# Patient Record
Sex: Female | Born: 1995 | Race: Black or African American | Hispanic: No | Marital: Single | Smoking: Former smoker
Health system: Southern US, Community
[De-identification: ages and names within clinical notes are randomized; demographics above are authoritative.]

## PROBLEM LIST (undated history)

## (undated) ENCOUNTER — Inpatient Hospital Stay (HOSPITAL_COMMUNITY): Payer: Self-pay

## (undated) DIAGNOSIS — D649 Anemia, unspecified: Secondary | ICD-10-CM

## (undated) DIAGNOSIS — G009 Bacterial meningitis, unspecified: Secondary | ICD-10-CM

## (undated) DIAGNOSIS — Z789 Other specified health status: Secondary | ICD-10-CM

## (undated) HISTORY — PX: NO PAST SURGERIES: SHX2092

---
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
## 2017-01-28 ENCOUNTER — Emergency Department (HOSPITAL_COMMUNITY)
Admission: EM | Admit: 2017-01-28 | Discharge: 2017-01-28 | Disposition: A | Discharge status: Home / Self Care | Payer: Self-pay | Attending: Emergency Medicine | Admitting: Emergency Medicine

## 2017-01-28 ENCOUNTER — Encounter (HOSPITAL_COMMUNITY): Payer: Self-pay | Admitting: Emergency Medicine

## 2017-01-28 ENCOUNTER — Other Ambulatory Visit: Payer: Self-pay

## 2017-01-28 DIAGNOSIS — N390 Urinary tract infection, site not specified: Secondary | ICD-10-CM | POA: Insufficient documentation

## 2017-01-28 DIAGNOSIS — A5901 Trichomonal vulvovaginitis: Secondary | ICD-10-CM

## 2017-01-28 DIAGNOSIS — N760 Acute vaginitis: Secondary | ICD-10-CM | POA: Insufficient documentation

## 2017-01-28 DIAGNOSIS — B9689 Other specified bacterial agents as the cause of diseases classified elsewhere: Secondary | ICD-10-CM

## 2017-01-28 DIAGNOSIS — A5900 Urogenital trichomoniasis, unspecified: Secondary | ICD-10-CM | POA: Insufficient documentation

## 2017-01-28 LAB — WET PREP, GENITAL
Sperm: NONE SEEN
YEAST WET PREP: NONE SEEN

## 2017-01-28 LAB — URINALYSIS, ROUTINE W REFLEX MICROSCOPIC
Bilirubin Urine: NEGATIVE
GLUCOSE, UA: NEGATIVE mg/dL
HGB URINE DIPSTICK: NEGATIVE
KETONES UR: NEGATIVE mg/dL
NITRITE: POSITIVE — AB
PROTEIN: NEGATIVE mg/dL
Specific Gravity, Urine: 1.025 (ref 1.005–1.030)
pH: 6 (ref 5.0–8.0)

## 2017-01-28 LAB — POC URINE PREG, ED: PREG TEST UR: NEGATIVE

## 2017-01-28 MED ORDER — AZITHROMYCIN 250 MG PO TABS
1000.0000 mg | ORAL_TABLET | Freq: Once | ORAL | Status: AC
  Administered 2017-01-28: 1000 mg via ORAL
  Filled 2017-01-28: qty 4

## 2017-01-28 MED ORDER — METRONIDAZOLE 500 MG PO TABS: 500.0000 mg | ORAL_TABLET | Freq: Two times a day (BID) | ORAL | 0 refills | Status: DC

## 2017-01-28 MED ORDER — LIDOCAINE HCL (PF) 1 % IJ SOLN: 2.0000 mL | Freq: Once | INTRAMUSCULAR | Status: DC

## 2017-01-28 MED ORDER — METRONIDAZOLE 500 MG PO TABS: 2000.0000 mg | ORAL_TABLET | Freq: Once | ORAL | Status: DC

## 2017-01-28 MED ORDER — CEFTRIAXONE SODIUM 250 MG IJ SOLR
250.0000 mg | Freq: Once | INTRAMUSCULAR | Status: AC
  Administered 2017-01-28: 250 mg via INTRAMUSCULAR
  Filled 2017-01-28: qty 250

## 2017-01-28 MED ORDER — CEPHALEXIN 500 MG PO CAPS: 500.0000 mg | ORAL_CAPSULE | Freq: Two times a day (BID) | ORAL | 0 refills | Status: AC

## 2017-01-28 NOTE — Discharge Instructions (Addendum)
Please read the attached information regarding your condition. Take Flagyl and Keflex for one week as directed. We will contact you regarding results of your remaining lab work when available. Return to ED for worsening symptoms, increase pain with urination, abdominal pain, fevers.

## 2017-01-28 NOTE — ED Triage Notes (Signed)
Pt reports vaginal itching x1 month, endorses vaginal discharge but states no odor. Denies any bleeding.

## 2017-01-28 NOTE — ED Provider Notes (Signed)
[facility] EMERGENCY DEPARTMENT Provider Note   CSN: [id number] Arrival date & time: 01/28/17  1135     History   Chief Complaint Chief Complaint  Patient presents with  . Vaginal Itching    HPI Infant Allison Robles is a 21 y.o. female to ED for evaluation of 2-week history of vaginal discharge and vaginal irritation.  She also reports dysuria.  Describes the discharge as white and associated with no foul odor.  States that the last time she had unprotected sexual intercourse was several weeks ago.  Reports history of similar symptoms in the past.  Also reports that her menstrual cycle has been irregular.  She denies any abnormal bleeding, abdominal pain, nausea, vomiting, bowel changes, fever.  HPI  Past Medical History:  Diagnosis Date  . Medical history non-contributory     There are no active problems to display for this patient.   Past Surgical History:  Procedure Laterality Date  . NO PAST SURGERIES      OB History    Gravida Para Term Preterm AB Living   0 0 0 0 0 0   SAB TAB Ectopic Multiple Live Births   0 0 0 0 0       Home Medications    Prior to Admission medications   Medication Sig Start Date End Date Taking? Authorizing Provider  cephALEXin (KEFLEX) 500 MG capsule Take 1 capsule (500 mg total) by mouth 2 (two) times daily for 7 days. 01/28/17 02/04/17  Vianny Schraeder, PA-C  metroNIDAZOLE (FLAGYL) 500 MG tablet Take 1 tablet (500 mg total) by mouth 2 (two) times daily. 01/28/17   Dietrich Pates, PA-C    Family History No family history on file.  Social History Social History   Tobacco Use  . Smoking status: Never Smoker  . Smokeless tobacco: Never Used  Substance Use Topics  . Alcohol use: No  . Drug use: No     Allergies   Patient has no known allergies.   Review of Systems Review of Systems  Constitutional: Negative for appetite change, chills and fever.  HENT: Negative for ear pain, rhinorrhea, sneezing and sore  throat.   Eyes: Negative for photophobia and visual disturbance.  Respiratory: Negative for cough, chest tightness, shortness of breath and wheezing.   Cardiovascular: Negative for chest pain and palpitations.  Gastrointestinal: Negative for abdominal pain, blood in stool, constipation, diarrhea, nausea and vomiting.  Genitourinary: Positive for dysuria, menstrual problem and vaginal discharge. Negative for hematuria and urgency.  Musculoskeletal: Negative for myalgias.  Skin: Negative for rash.  Neurological: Negative for dizziness, weakness and light-headedness.     Physical Exam Updated Vital Signs BP 125/67   Pulse 65   Temp 98.1 F (36.7 C) (Oral)   Resp 12   LMP 01/02/2017 (Approximate)   SpO2 100%   Physical Exam  Constitutional: She appears well-developed and well-nourished. No distress.  HENT:  Head: Normocephalic and atraumatic.  Nose: Nose normal.  Eyes: Conjunctivae and EOM are normal. Left eye exhibits no discharge. No scleral icterus.  Neck: Normal range of motion. Neck supple.  Cardiovascular: Normal rate, regular rhythm, normal heart sounds and intact distal pulses. Exam reveals no gallop and no friction rub.  No murmur heard. Pulmonary/Chest: Effort normal and breath sounds normal. No respiratory distress.  Abdominal: Soft. Bowel sounds are normal. She exhibits no distension. There is no tenderness. There is no guarding.  Genitourinary: There is no tenderness on the right labia. There is no tenderness on the  left labia. Cervix exhibits no motion tenderness and no discharge. Right adnexum displays no tenderness. Left adnexum displays no tenderness. Vaginal discharge found.  Genitourinary Comments: Pelvic exam: normal external genitalia without evidence of trauma. VULVA: normal appearing vulva with no masses, tenderness or lesion. VAGINA: normal appearing vagina with normal color and discharge, no lesions. CERVIX: normal appearing cervix without lesions, cervical  motion tenderness absent, cervical os closed with out purulent discharge; white vaginal discharge noted. Wet prep and DNA probe for chlamydia and GC obtained.   ADNEXA: normal adnexa in size, nontender and no masses UTERUS: uterus is normal size, shape, consistency and nontender.   NT served as chaperone during exam.  Musculoskeletal: Normal range of motion. She exhibits no edema.  Neurological: She is alert. She exhibits normal muscle tone. Coordination normal.  Skin: Skin is warm and dry. No rash noted.  Psychiatric: She has a normal mood and affect.  Nursing note and vitals reviewed.    ED Treatments / Results  Labs (all labs ordered are listed, but only abnormal results are displayed) Labs Reviewed  WET PREP, GENITAL - Abnormal; Notable for the following components:      Result Value   Trich, Wet Prep PRESENT (*)    Clue Cells Wet Prep HPF POC PRESENT (*)    WBC, Wet Prep HPF POC FEW (*)    All other components within normal limits  URINALYSIS, ROUTINE W REFLEX MICROSCOPIC - Abnormal; Notable for the following components:   APPearance HAZY (*)    Nitrite POSITIVE (*)    Leukocytes, UA SMALL (*)    Bacteria, UA MANY (*)    Squamous Epithelial / LPF 0-5 (*)    All other components within normal limits  HIV ANTIBODY (ROUTINE TESTING)  RPR  POC URINE PREG, ED  GC/CHLAMYDIA PROBE AMP ([place]    EKG  EKG Interpretation None       Radiology No results found.  Procedures Procedures (including critical care time)  Medications Ordered in ED Medications  lidocaine (PF) (XYLOCAINE) 1 % injection 2 mL (2 mLs Other Not Given 01/28/17 1621)  cefTRIAXone (ROCEPHIN) injection 250 mg (250 mg Intramuscular Given 01/28/17 1620)  azithromycin (ZITHROMAX) tablet 1,000 mg (1,000 mg Oral Given 01/28/17 1620)     Initial Impression / Assessment and Plan / ED Course  I have reviewed the triage vital signs and the nursing notes.  Pertinent labs & imaging results  that were available during my care of the patient were reviewed by me and considered in my medical decision making (see chart for details).     Patient presents to ED for evaluation of back 2 weeks.  States she is sexually active with out protection.  There is white vaginal discharge noted in vaginal vault.  Patient is concerned for STDs and would like to be empirically treated.  No signs of PID noted on physical exam.  No abdominal tenderness to palpation she is afebrile with no history of fever.  Urinalysis with positive nitrites, leukocytes and bacteria.  Wet prep positive for Trichomonas and clue cells.  GC, chlamydia, HIV and RPR pending at this time.  Urine pregnancy is negative.  Will treat with Flagyl for trichomonas and BV and advised her to follow-up with results of remaining lab work when available. Patient appears stable for discharge at this time.  Strict return precautions given.  Final Clinical Impressions(s) / ED Diagnoses   Final diagnoses:  BV (bacterial vaginosis)  Trichomoniasis of vagina  Lower urinary tract infectious disease    ED Discharge Orders        Ordered    cephALEXin (KEFLEX) 500 MG capsule  2 times daily     01/28/17 1624    metroNIDAZOLE (FLAGYL) 500 MG tablet  2 times daily     01/28/17 1624       Dietrich PatesKhatri, Lizann Edelman, PA-C 01/28/17 1630    Melene PlanFloyd, Dan, DO 01/29/17 1603

## 2017-01-29 LAB — GC/CHLAMYDIA PROBE AMP (~~LOC~~) NOT AT ARMC
Chlamydia: NEGATIVE
Neisseria Gonorrhea: POSITIVE — AB

## 2017-01-29 LAB — RPR: RPR: NONREACTIVE

## 2017-01-29 LAB — HIV ANTIBODY (ROUTINE TESTING W REFLEX): HIV Screen 4th Generation wRfx: NONREACTIVE

## 2018-01-10 ENCOUNTER — Emergency Department (HOSPITAL_COMMUNITY)
Admission: EM | Admit: 2018-01-10 | Discharge: 2018-01-10 | Disposition: A | Discharge status: Home / Self Care | Payer: Self-pay | Attending: Emergency Medicine | Admitting: Emergency Medicine

## 2018-01-10 ENCOUNTER — Encounter (HOSPITAL_COMMUNITY): Payer: Self-pay | Admitting: Obstetrics and Gynecology

## 2018-01-10 ENCOUNTER — Emergency Department (HOSPITAL_COMMUNITY): Payer: Self-pay

## 2018-01-10 ENCOUNTER — Other Ambulatory Visit: Payer: Self-pay

## 2018-01-10 DIAGNOSIS — Z3A00 Weeks of gestation of pregnancy not specified: Secondary | ICD-10-CM | POA: Insufficient documentation

## 2018-01-10 DIAGNOSIS — O9229 Other disorders of breast associated with pregnancy and the puerperium: Secondary | ICD-10-CM | POA: Insufficient documentation

## 2018-01-10 DIAGNOSIS — N644 Mastodynia: Secondary | ICD-10-CM

## 2018-01-10 DIAGNOSIS — Z3201 Encounter for pregnancy test, result positive: Secondary | ICD-10-CM

## 2018-01-10 DIAGNOSIS — F17290 Nicotine dependence, other tobacco product, uncomplicated: Secondary | ICD-10-CM | POA: Insufficient documentation

## 2018-01-10 DIAGNOSIS — O99334 Smoking (tobacco) complicating childbirth: Secondary | ICD-10-CM | POA: Insufficient documentation

## 2018-01-10 HISTORY — DX: Anemia, unspecified: D64.9

## 2018-01-10 LAB — POC URINE PREG, ED: PREG TEST UR: POSITIVE — AB

## 2018-01-10 NOTE — ED Triage Notes (Signed)
Per Pt: Pt reports concerns about her right nipple because it is cold outside and it got hard and started to hurt. Pt reports it has never hurt before and she is concerned. Pt reports the pain shot to her back. Pt is wearing a tube top at this time.  Pt reports she has cut down on her marijuana usage and is unsure if she is pregnant.

## 2018-01-10 NOTE — Discharge Instructions (Signed)
You were seen in the ER today for pain to the nipple/breast. Your work up here was overall reassuring. Your pregnancy test is positive. We would like you to start taking a prenatal vitamin daily- please discuss this with the pharmacist and they can help you find the correct type of prenatal vitamin. Please do not drink alcohol or do drugs. Please do not smoke cigarettes. Please only take tylenol per over the counter dosing for pain as this is what is safe in pregnancy.   Please follow up with the obgyn clinic in your discharge instruction within 1 week to establish care and have further testing. Return to the ER or go to the MAU (women's ER in your discharge instructions) for any new or worsening symptoms or any other concerns including but not limited to persistent pain, worsened pain, trouble breathing, redness or drainage from the nipple, abdominal pain, or vaginal bleeding.

## 2018-01-10 NOTE — ED Provider Notes (Signed)
[facility]-EMERGENCY DEPT Provider Note   CSN: [id number] Arrival date & time: 01/10/18  1924     History   Chief Complaint Chief Complaint  Patient presents with  . Breast Pain  . Back Pain    HPI Allison Robles is a 22 y.o. female with a hx of anemia and tobacco abuse who presents to the ED with complaint of breast pain. Patient states that yesterday she walked out into the cold and developed pain to the L nipple which radiated to her lateral chest wall and around to the back. Pain lasted less than 30 minutes, was sharp in nature, and was alleviated with being out of the cold. She state since pain improved she did not come to the ER. Today she again went into the cold and had a similar pain develop to bilateral nipples. The sxs quickly resolved on the R, but have persisted on the L prompting ER visit. Only area of pain is the L nipple, currently a 4/10 in severity, feels "sharp and sensitive." No intervention tried PTA. Denies chest pain, dyspnea, nausea, or vomiting. Denies color change, fever, or nipple drainage. Denies leg pain/swelling, cough, hemoptysis, recent surgery/trauma, recent long travel, hormone use, personal hx of cancer, or hx of DVT/PE.   Patient believes her LMP was over 1 month ago and that she is late. Unsure of pregnancy or not. Denies abdominal/pelvic pain or vaginal bleeding.     HPI  Past Medical History:  Diagnosis Date  . Anemia   . Medical history non-contributory     There are no active problems to display for this patient.   Past Surgical History:  Procedure Laterality Date  . NO PAST SURGERIES       OB History    Gravida  0   Para  0   Term  0   Preterm  0   AB  0   Living  0     SAB  0   TAB  0   Ectopic  0   Multiple  0   Live Births  0            Home Medications    Prior to Admission medications   Medication Sig Start Date End Date Taking? Authorizing Provider  metroNIDAZOLE (FLAGYL)  500 MG tablet Take 1 tablet (500 mg total) by mouth 2 (two) times daily. 01/28/17   Dietrich Pates, PA-C    Family History History reviewed. No pertinent family history.  Social History Social History   Tobacco Use  . Smoking status: Current Every Day Smoker    Types: Cigars  . Smokeless tobacco: Never Used  . Tobacco comment: Pt smokes 3 black and milds a day  Substance Use Topics  . Alcohol use: No  . Drug use: Yes    Types: Marijuana     Allergies   Patient has no known allergies.   Review of Systems Review of Systems  Constitutional: Negative for chills and fever.  Respiratory: Negative for cough and shortness of breath.   Cardiovascular: Negative for chest pain and leg swelling.  Gastrointestinal: Negative for abdominal pain, diarrhea, nausea and vomiting.  Genitourinary: Negative for dysuria, vaginal bleeding and vaginal discharge.       Positive for breast pain.   All other systems reviewed and are negative.    Physical Exam Updated Vital Signs BP 121/75 (BP Location: Left Arm)   Pulse 82   Temp 97.7 F (36.5 C) (Oral)   Resp  16   LMP 12/06/2017 (Approximate)   SpO2 99%   Physical Exam  Constitutional: She appears well-developed and well-nourished.  Non-toxic appearance. No distress.  HENT:  Head: Normocephalic and atraumatic.  Eyes: Conjunctivae are normal. Right eye exhibits no discharge. Left eye exhibits no discharge.  Neck: Neck supple.  Cardiovascular: Normal rate and regular rhythm.  Pulmonary/Chest: Effort normal and breath sounds normal. No respiratory distress. She has no wheezes. She has no rhonchi. She has no rales. Right breast exhibits no inverted nipple, no mass, no nipple discharge, no skin change and no tenderness. Left breast exhibits tenderness (to areola, otherwise nontender). Left breast exhibits no inverted nipple, no mass, no nipple discharge and no skin change.  Respiration even and unlabored. Pectus deformity noted.   No overlying  erythema, warmth, or palpable fluctuance to the breast region. No obvious abscess. No signs of infection.  EDT present as chaperone.   Abdominal: Soft. She exhibits no distension. There is no tenderness. There is no rebound and no guarding.  Musculoskeletal: She exhibits no edema or tenderness.  Neurological: She is alert.  Clear speech.   Skin: Skin is warm and dry. No rash noted.  Psychiatric: She has a normal mood and affect. Her behavior is normal.  Nursing note and vitals reviewed.    ED Treatments / Results  Labs (all labs ordered are listed, but only abnormal results are displayed) Labs Reviewed  POC URINE PREG, ED - Abnormal; Notable for the following components:      Result Value   Preg Test, Ur POSITIVE (*)    All other components within normal limits    EKG EKG Interpretation  Date/Time:  Sunday January 10 2018 20:06:53 EST Ventricular Rate:  87 PR Interval:    QRS Duration: 73 QT Interval:  328 QTC Calculation: 395 R Axis:   98 Text Interpretation:  Sinus rhythm Nonspecific T wave abnormality No previous tracing Confirmed by Cathren LaineSteinl, Kevin ([id number]) on 01/10/2018 9:09:12 PM   Radiology Dg Chest 2 View  Result Date: 01/10/2018 CLINICAL DATA:  Concerns about right nipple. Right nipple hurt when it got cold outside. Pain radiated to back. EXAM: CHEST - 2 VIEW COMPARISON:  None. FINDINGS: Haziness adjacent to the right side of the heart is likely due to the patient's pectus deformity best appreciated on the lateral view. The cardiomediastinal silhouette is otherwise normal. No pneumothorax. No other acute abnormalities. IMPRESSION: The haziness adjacent to the right heart border is likely due to the patient's pectus deformity. No other abnormalities noted. Electronically Signed   By: Gerome Samavid  Williams III M.D   On: 01/10/2018 20:47    Procedures Procedures (including critical care time)  Medications Ordered in ED Medications - No data to display   Initial  Impression / Assessment and Plan / ED Course  I have reviewed the triage vital signs and the nursing notes.  Pertinent labs & imaging results that were available during my care of the patient were reviewed by me and considered in my medical decision making (see chart for details).   Patient presents to the ED with complaint of breast pain, more so to the nipple area after being out in the cold. Exam fairly benign, notable for some tenderness to the areola region. She has no overlying erythema/warmth to suggest infectious process such as mastitis, no palpable fluctuance to suggest abscess. No palpable masses. Screening EKG and CXR ordered given pain radiated to diffuse chest wall yesterday, not today, seems focal to breast- no STEMI, no  acute findings on CXR such as PTX or pneumonia. Her pregnancy test today is positive, while patient is in a hypercoagulable state with being pregnant, wells score is low risk,  The pain is reproducible with areolar palpation and her ambulatory SpO2 remains > 95%, doubt PE. Patient without abdominal pain or vaginal bleeding to prompt emergent ultrasound regarding new pregnancy. Suspect breast pain is multifactorial with cold weather and pregnancy. Discussed starting prenatal, cessation of tobacco/alcohol/drugs, and discussing any medications with pharmacy to determine safety in pregnancy. Follow up with women's. I discussed results, treatment plan, need for follow-up, and return precautions with the patient. Provided opportunity for questions, patient confirmed understanding and is in agreement with plan.    Final Clinical Impressions(s) / ED Diagnoses   Final diagnoses:  Nipple pain  Positive pregnancy test  Breast pain    ED Discharge Orders    None       Cherly Anderson, PA-C 01/10/18 2121    Cathren Laine, MD 01/11/18 1146

## 2018-01-17 ENCOUNTER — Emergency Department (HOSPITAL_COMMUNITY)
Admission: EM | Admit: 2018-01-17 | Discharge: 2018-01-17 | Disposition: A | Discharge status: Home / Self Care | Payer: Medicaid Other | Attending: Emergency Medicine | Admitting: Emergency Medicine

## 2018-01-17 ENCOUNTER — Other Ambulatory Visit: Payer: Self-pay

## 2018-01-17 ENCOUNTER — Encounter (HOSPITAL_COMMUNITY): Payer: Self-pay | Admitting: Emergency Medicine

## 2018-01-17 DIAGNOSIS — Z3A08 8 weeks gestation of pregnancy: Secondary | ICD-10-CM | POA: Insufficient documentation

## 2018-01-17 DIAGNOSIS — F17210 Nicotine dependence, cigarettes, uncomplicated: Secondary | ICD-10-CM | POA: Insufficient documentation

## 2018-01-17 DIAGNOSIS — Z0489 Encounter for examination and observation for other specified reasons: Secondary | ICD-10-CM | POA: Insufficient documentation

## 2018-01-17 NOTE — ED Triage Notes (Signed)
Patient reports positive pregnancy test when seen on 11/17. Patient denies follow up with outpatient clinic. Stating "I want to know how far along I am."

## 2018-01-17 NOTE — ED Provider Notes (Signed)
[facility]-EMERGENCY DEPT Provider Note   CSN: [id number] Arrival date & time: 01/17/18  1832     History   Chief Complaint Chief Complaint  Patient presents with  . Routine Prenatal Visit    HPI Allison Robles is a 22 y.o. female who presents to the ED with request to see how far pregnant she is. Patient was evaluated here 11/17 for breast tenderness. Patient denies any other problems. She was given [facility] at her last visit but did not schedule an appointment. Patient not having any problems and is requesting information regarding abortion. Chart states patient's LMP was 10/27 but she reports her LMP was 11/20/17.   HPI  Past Medical History:  Diagnosis Date  . Anemia   . Medical history non-contributory     There are no active problems to display for this patient.   Past Surgical History:  Procedure Laterality Date  . NO PAST SURGERIES       OB History    Gravida  0   Para  0   Term  0   Preterm  0   AB  0   Living  0     SAB  0   TAB  0   Ectopic  0   Multiple  0   Live Births  0            Home Medications    Prior to Admission medications   Medication Sig Start Date End Date Taking? Authorizing Provider  metroNIDAZOLE (FLAGYL) 500 MG tablet Take 1 tablet (500 mg total) by mouth 2 (two) times daily. 01/28/17   Dietrich Pates, PA-C    Family History No family history on file.  Social History Social History   Tobacco Use  . Smoking status: Current Every Day Smoker    Types: Cigars  . Smokeless tobacco: Never Used  . Tobacco comment: Pt smokes 3 black and milds a day  Substance Use Topics  . Alcohol use: No  . Drug use: Yes    Types: Marijuana     Allergies   Patient has no known allergies.   Review of Systems Review of Systems  Genitourinary:       Pregnant  All other systems reviewed and are negative.    Physical Exam Updated Vital Signs BP (!) 142/86 (BP  Location: Left Arm)   Pulse 71   Temp 98.6 F (37 C) (Oral)   Resp 18   LMP 12/20/2017 (Approximate)   SpO2 100%   Physical Exam  Constitutional: She appears well-developed and well-nourished. No distress.  HENT:  Head: Normocephalic.  Eyes: EOM are normal.  Neck: Neck supple.  Cardiovascular: Normal rate.  Pulmonary/Chest: Effort normal.  Neurological: She is alert.  Skin: Skin is warm and dry.  Psychiatric: She has a normal mood and affect.  Nursing note and vitals reviewed.    ED Treatments / Results  Labs (all labs ordered are listed, but only abnormal results are displayed) Labs Reviewed - No data to display  Radiology No results found.  Procedures Procedures (including critical care time)  Medications Ordered in ED Medications - No data to display   Initial Impression / Assessment and Plan / ED Course  I have reviewed the triage vital signs and the nursing notes. 22 y.o. female here for information concerning how far pregnant she is and for information regarding abortion. I discussed with the patient that she can [facility] and discuss options for the pregnancy and the visit is free. I discussed if she is sure she is going for abortion she can look on the Internet for clinics. Patient stable for d/c without any problems today. Final Clinical Impressions(s) / ED Diagnoses   Final diagnoses:  [redacted] weeks gestation of pregnancy    ED Discharge Orders    None       Kerrie Buffaloeese, Welton Bord [place], [place] 01/17/18 2129    Tegeler, Canary Brimhristopher J, MD 01/17/18 [phone]

## 2018-06-15 ENCOUNTER — Telehealth: Payer: Self-pay | Admitting: Obstetrics and Gynecology

## 2018-06-23 NOTE — Telephone Encounter (Signed)
Opened in error

## 2018-10-11 ENCOUNTER — Other Ambulatory Visit: Payer: Self-pay

## 2018-10-11 ENCOUNTER — Ambulatory Visit (INDEPENDENT_AMBULATORY_CARE_PROVIDER_SITE_OTHER): Payer: Medicaid Other | Admitting: General Practice

## 2018-10-11 DIAGNOSIS — Z3201 Encounter for pregnancy test, result positive: Secondary | ICD-10-CM | POA: Diagnosis not present

## 2018-10-11 DIAGNOSIS — Z3491 Encounter for supervision of normal pregnancy, unspecified, first trimester: Secondary | ICD-10-CM

## 2018-10-11 LAB — POCT PREGNANCY, URINE: Preg Test, Ur: POSITIVE — AB

## 2018-10-11 MED ORDER — PRENATAL PLUS 27-1 MG PO TABS: 1.0000 | ORAL_TABLET | Freq: Every day | ORAL | 11 refills | Status: DC

## 2018-10-11 MED ORDER — PRENATAL PLUS 27-1 MG PO TABS: 1.0000 | ORAL_TABLET | Freq: Every day | ORAL | 0 refills | Status: DC

## 2018-10-11 NOTE — Progress Notes (Signed)
Patient presents to office today for UPT. UPT +. Patient reports first positive home test last week. Patient reports recent TAB on 5/18 and bled until early June- has not had a period since then. Patient is not taking any meds/vitamins. Will schedule for ultrasound to establish dating- scheduled 8/25 @ 1pm. PNV Rx sent to pharmacy & patient informed. Patient will return to office next week for ultrasound results.  Koren Bound RN BSN 10/11/18

## 2018-10-11 NOTE — Progress Notes (Signed)
Patient seen and assessed by nursing staff.  Agree with documentation and plan.  

## 2018-10-19 ENCOUNTER — Ambulatory Visit (INDEPENDENT_AMBULATORY_CARE_PROVIDER_SITE_OTHER): Payer: Self-pay

## 2018-10-19 ENCOUNTER — Ambulatory Visit (HOSPITAL_COMMUNITY)
Admission: RE | Admit: 2018-10-19 | Discharge: 2018-10-19 | Disposition: A | Discharge status: Home / Self Care | Payer: Self-pay | Source: Ambulatory Visit | Attending: Family Medicine | Admitting: Family Medicine

## 2018-10-19 ENCOUNTER — Other Ambulatory Visit: Payer: Self-pay

## 2018-10-19 ENCOUNTER — Other Ambulatory Visit: Payer: Self-pay | Admitting: Family Medicine

## 2018-10-19 ENCOUNTER — Encounter: Payer: Self-pay | Admitting: Family Medicine

## 2018-10-19 DIAGNOSIS — Z712 Person consulting for explanation of examination or test findings: Secondary | ICD-10-CM

## 2018-10-19 DIAGNOSIS — Z3491 Encounter for supervision of normal pregnancy, unspecified, first trimester: Secondary | ICD-10-CM

## 2018-10-19 NOTE — Progress Notes (Signed)
Pt here today for OB US results.  Notified Maye Hides, CNM results.  Pt informed that she has a viable pregnancy with EDD 04/29/19, FHR 151 bpm, 12w 4d today.  Medications reconciled.  [place] pics given to pt.  [place] office to provide proof of pregnancy letter to start OB care.  Pt did not have any further questions.   Mel Almond, RN 10/19/18

## 2018-10-24 NOTE — Progress Notes (Signed)
I reviewed the note and agree with the nursing assessment and plan.   Mcarthur Ivins, CNM 05/22/2017 10:32 AM   

## 2018-11-16 ENCOUNTER — Telehealth: Payer: Self-pay | Admitting: Obstetrics & Gynecology

## 2018-11-16 ENCOUNTER — Telehealth (INDEPENDENT_AMBULATORY_CARE_PROVIDER_SITE_OTHER): Payer: Self-pay

## 2018-11-16 DIAGNOSIS — Z3A16 16 weeks gestation of pregnancy: Secondary | ICD-10-CM

## 2018-11-16 NOTE — Telephone Encounter (Signed)
Pt left message on nurse call voicemail requesting additional copy of proof of pregnancy letter.   Called pt to address concerns. Pt states she came to the front office today and was provided with a copy of the requested letter.   Pt has no further questions or concerns at this time.   Apolonio Schneiders, RN 11/16/18

## 2018-11-16 NOTE — Telephone Encounter (Signed)
Spoke with patient about her appointment on 9/23 @ 10:30. Patient instructed that the appointment is a telephone visit. Patient instructed to be available at her appointment time. Patient verbalized understanding.

## 2018-11-17 ENCOUNTER — Ambulatory Visit (INDEPENDENT_AMBULATORY_CARE_PROVIDER_SITE_OTHER): Payer: Self-pay | Admitting: *Deleted

## 2018-11-17 ENCOUNTER — Encounter: Payer: Medicaid Other | Admitting: Obstetrics & Gynecology

## 2018-11-17 ENCOUNTER — Other Ambulatory Visit: Payer: Self-pay

## 2018-11-17 DIAGNOSIS — Z3490 Encounter for supervision of normal pregnancy, unspecified, unspecified trimester: Secondary | ICD-10-CM | POA: Insufficient documentation

## 2018-11-17 NOTE — Progress Notes (Signed)
10:30 I called Marayah and left a message I am calling for your telephone visit and will call again in a few minutes since I did not reach you- please be available by phone.  Linda,RN  10:37 I called Hedi and left a message I am calling for your telephone visit and since I did not reach you - your visit will need to be rescheduled- please call our office- I will try your contact number also. I called her contact number Alden Benjamin) and left a message I am trying to reach [place] and you are listed as a contact- please inform her we are trying to reach her re: her appointment and to call us.  Linda,RN

## 2018-11-19 ENCOUNTER — Other Ambulatory Visit: Payer: Self-pay | Admitting: Family Medicine

## 2018-11-19 DIAGNOSIS — Z3491 Encounter for supervision of normal pregnancy, unspecified, first trimester: Secondary | ICD-10-CM

## 2018-12-06 ENCOUNTER — Ambulatory Visit: Payer: Self-pay | Admitting: *Deleted

## 2018-12-06 ENCOUNTER — Other Ambulatory Visit: Payer: Self-pay

## 2018-12-06 DIAGNOSIS — Z3490 Encounter for supervision of normal pregnancy, unspecified, unspecified trimester: Secondary | ICD-10-CM

## 2018-12-06 NOTE — Progress Notes (Signed)
2:27: I called Suellyn for her new OB Intake phone visit and heard a message " I'm sorry your call cannot be completed at this time- please hang up and try your  call again later". Shatoria Stooksbury,RN 2:37 : I called Larrissa for her new OB Intake phone visit and heard a message " I'm sorry your call cannot be completed at this time- please hang up and try your  call again later". I called her contact number and left a message we are trying to reach [place] and you are listed as a contact number- can you please give her the message we are trying to reach her and to call us? Will leave message for registrar to reschedule her new ob intake. Tawana Pasch,RN

## 2018-12-19 NOTE — Progress Notes (Signed)
History:   Allison Robles is a 23 y.o. G1P0000 at 64w3dby early [place] seen today for her first obstetrical visit.  Her obstetrical history is significant for first pregnancy. Patient does intend to breast feed. Pregnancy history fully reviewed.  Pt reports this is a desired and planned pregnancy. Allergies: NKDA Current Medications: PNVs PMH: none. No HTN, DM, asthma. PSH: none. OB Hx: this is patient's first pregnancy Social Hx: pt does not smoke, drink, or use drugs. Family Hx: none. Pt accepts flu vaccine.  Patient reports headache. Pt rates HA as 7/10 today and has persisted for the past few weeks. BP normal today. Pt reports she has not taken anything for her HA. Pt advised to take Tylenol 10064mafter returning home and if HA does not resolve, pt to present to MAU.     HISTORY: OB History  Gravida Para Term Preterm AB Living  1 0 0 0 0 0  SAB TAB Ectopic Multiple Live Births  0 0 0 0 0    # Outcome Date GA Lbr Len/2nd Weight Sex Delivery Anes PTL Lv  1 Current             Last pap smear was done today.  Past Medical History:  Diagnosis Date  . Anemia   . Medical history non-contributory    Past Surgical History:  Procedure Laterality Date  . NO PAST SURGERIES     History reviewed. No pertinent family history. Social History   Tobacco Use  . Smoking status: Current Every Day Smoker    Types: Cigars  . Smokeless tobacco: Never Used  . Tobacco comment: Pt smokes 3 black and milds a day  Substance Use Topics  . Alcohol use: No  . Drug use: Yes    Types: Marijuana   No Known Allergies Current Outpatient Medications on File Prior to Visit  Medication Sig Dispense Refill  . Prenatal Vit-Fe Fumarate-FA (PREPLUS) 27-1 MG TABS TAKE 1 TABLET BY MOUTH DAILY AT 12 NOON. 30 tablet 0   No current facility-administered medications on file prior to visit.     Review of Systems Pertinent items noted in HPI and remainder of comprehensive ROS otherwise  negative. Physical Exam:   Vitals:   12/20/18 1016  BP: 113/71  Pulse: 80  Weight: 133 lb 12.8 oz (60.7 kg)   Fetal Heart Rate (bpm): 150 Uterus:     Pelvic Exam: Perineum: no hemorrhoids, normal perineum   Vulva: normal external genitalia, no lesions   Vagina:  normal mucosa, normal discharge   Cervix: no lesions and normal, pap smear done.    Adnexa: normal adnexa and no mass, fullness, tenderness   Bony Pelvis: average  System: General: well-developed, well-nourished female in no acute distress   Breasts:  normal appearance, no masses or tenderness bilaterally   Skin: normal coloration and turgor, no rashes   Neurologic: oriented, normal, negative, normal mood   Extremities: normal strength, tone, and muscle mass, ROM of all joints is normal   HEENT PERRLA, extraocular movement intact and sclera clear, anicteric   Mouth/Teeth mucous membranes moist, pharynx normal without lesions and dental hygiene good   Neck supple and no masses   Cardiovascular: regular rate and rhythm   Respiratory:  no respiratory distress, normal breath sounds   Abdomen: soft, non-tender; bowel sounds normal; no masses,  no organomegaly      Assessment:    Pregnancy: G1P0000 Patient Active Problem List   Diagnosis Date Noted  . Supervision  of low-risk pregnancy 11/17/2018     Plan:    1. Encounter for supervision of low-risk pregnancy in second trimester - see above for pt HA today - AFP today - Cytology - PAP( [place]) - Obstetric Panel, Including HIV - Urine Culture - Flu Vaccine QUAD 36+ mos IM - Genetic Screening - US MFM OB COMP + 14 WK; Future - CHL AMB BABYSCRIPTS SCHEDULE OPTIMIZATION - Comp Met (CMET) - Protein / creatinine ratio, urine  Initial labs drawn. Continue prenatal vitamins. Genetic Screening discussed, NIPS: requested. Ultrasound discussed; fetal anatomic survey: requested. Problem list reviewed and updated. The nature of [facility] [place] with multiple MDs and other Advanced Practice Providers was explained to patient; also emphasized that residents, students are part of our team. Routine obstetric precautions reviewed. Return in about 4 weeks (around 01/17/2019) for virtual visit, needs anatomy scan ASAP order entered.     Clarisa Fling, NP  11:28 AM 12/20/2018 [facility] for [employer], [place]

## 2018-12-20 ENCOUNTER — Other Ambulatory Visit: Payer: Self-pay

## 2018-12-20 ENCOUNTER — Ambulatory Visit (INDEPENDENT_AMBULATORY_CARE_PROVIDER_SITE_OTHER): Payer: Self-pay | Admitting: Women's Health

## 2018-12-20 ENCOUNTER — Encounter: Payer: Self-pay | Admitting: Women's Health

## 2018-12-20 ENCOUNTER — Other Ambulatory Visit (HOSPITAL_COMMUNITY)
Admission: RE | Admit: 2018-12-20 | Discharge: 2018-12-20 | Disposition: A | Discharge status: Home / Self Care | Payer: Medicaid Other | Source: Ambulatory Visit | Attending: Women's Health | Admitting: Women's Health

## 2018-12-20 VITALS — BP 113/71 | HR 80 | Wt 133.8 lb

## 2018-12-20 DIAGNOSIS — Z3492 Encounter for supervision of normal pregnancy, unspecified, second trimester: Secondary | ICD-10-CM | POA: Diagnosis present

## 2018-12-20 DIAGNOSIS — Z23 Encounter for immunization: Secondary | ICD-10-CM

## 2018-12-20 DIAGNOSIS — Z3A21 21 weeks gestation of pregnancy: Secondary | ICD-10-CM

## 2018-12-20 MED ORDER — BLOOD PRESSURE MONITORING DEVI: 1.0000 | 0 refills | Status: AC

## 2018-12-20 NOTE — Addendum Note (Signed)
Addended by: Bethanne Ginger on: 12/20/2018 01:21 PM   Modules accepted: Orders

## 2018-12-20 NOTE — Patient Instructions (Signed)
Safe Medications in Pregnancy    Acne: Benzoyl Peroxide Salicylic Acid  Backache/Headache: Tylenol: 2 regular strength every 4 hours OR              2 Extra strength every 6 hours  Colds/Coughs/Allergies: Benadryl (alcohol free) 25 mg every 6 hours as needed Breath right strips Claritin Cepacol throat lozenges Chloraseptic throat spray Cold-Eeze- up to three times per day Cough drops, alcohol free Flonase (by prescription only) Guaifenesin Mucinex Robitussin DM (plain only, alcohol free) Saline nasal spray/drops Sudafed (pseudoephedrine) & Actifed ** use only after [redacted] weeks gestation and if you do not have high blood pressure Tylenol Vicks Vaporub Zinc lozenges Zyrtec   Constipation: Colace Ducolax suppositories Fleet enema Glycerin suppositories Metamucil Milk of magnesia Miralax Senokot Smooth move tea  Diarrhea: Kaopectate Imodium A-D  *NO pepto Bismol  Hemorrhoids: Anusol Anusol HC Preparation H Tucks  Indigestion: Tums Maalox Mylanta Zantac  Pepcid  Insomnia: Benadryl (alcohol free)  every 6 hours as needed Tylenol PM Unisom, no Gelcaps  Leg Cramps: Tums MagGel  Nausea/Vomiting:  Bonine Dramamine Emetrol Ginger extract Sea bands Meclizine  Nausea medication to take during pregnancy:  Unisom (doxylamine succinate 25 mg tablets) Take one tablet daily at bedtime. If symptoms are not adequately controlled, the dose can be increased to a maximum recommended dose of two tablets daily (1/2 tablet in the morning, 1/2 tablet mid-afternoon and one at bedtime). Vitamin B6  tablets. Take one tablet twice a day (up to 200 mg per day).  Skin Rashes: Aveeno products Benadryl cream or  every 6 hours as needed Calamine Lotion 1% cortisone cream  Yeast infection: Gyne-lotrimin 7 Monistat 7   **If taking multiple medications, please check labels to avoid duplicating the same active ingredients **take  medication as directed on the label ** Do not exceed 4000 mg of tylenol in 24 hours **Do not take medications that contain aspirin or ibuprofen    The Maternity Assessment Unit (MAU) is located at the [facility] at [facility]. The address is: [address], [place], [place], [place] [address]. Please see map below for additional directions.    The Maternity Assessment Unit is designed to help you during your pregnancy, and for up to 6 weeks after delivery, with any pregnancy- or postpartum-related emergencies, if you think you are in labor, or if your water has broken. For example, if you experience nausea and vomiting, vaginal bleeding, severe abdominal or pelvic pain, elevated blood pressure or other problems related to your pregnancy or postpartum time, please come to the Maternity Assessment Unit for assistance.  Second Trimester of Pregnancy The second trimester is from week 14 through week 27 (months 4 through 6). The second trimester is often a time when you feel your best. Your body has adjusted to being pregnant, and you begin to feel better physically. Usually, morning sickness has lessened or quit completely, you may have more energy, and you may have an increase in appetite. The second trimester is also a time when the fetus is growing rapidly. At the end of the sixth month, the fetus is about 9 inches long and weighs about 1 pounds. You will likely begin to feel the baby move (quickening) between 16 and 20 weeks of pregnancy. Body changes during your second trimester Your body continues to go through many changes during your second trimester. The changes vary from woman to woman.  Your weight will continue to increase. You will notice your lower abdomen  bulging out.  You may begin to get stretch marks on your hips, abdomen, and breasts.  You may develop headaches that can be relieved by medicines. The medicines should be approved by your health  care provider.  You may urinate more often because the fetus is pressing on your bladder.  You may develop or continue to have heartburn as a result of your pregnancy.  You may develop constipation because certain hormones are causing the muscles that push waste through your intestines to slow down.  You may develop hemorrhoids or swollen, bulging veins (varicose veins).  You may have back pain. This is caused by: ? Weight gain. ? Pregnancy hormones that are relaxing the joints in your pelvis. ? A shift in weight and the muscles that support your balance.  Your breasts will continue to grow and they will continue to become tender.  Your gums may bleed and may be sensitive to brushing and flossing.  Dark spots or blotches (chloasma, mask of pregnancy) may develop on your face. This will likely fade after the baby is born.  A dark line from your belly button to the pubic area (linea nigra) may appear. This will likely fade after the baby is born.  You may have changes in your hair. These can include thickening of your hair, rapid growth, and changes in texture. Some women also have hair loss during or after pregnancy, or hair that feels dry or thin. Your hair will most likely return to normal after your baby is born. What to expect at prenatal visits During a routine prenatal visit:  You will be weighed to make sure you and the fetus are growing normally.  Your blood pressure will be taken.  Your abdomen will be measured to track your baby's growth.  The fetal heartbeat will be listened to.  Any test results from the previous visit will be discussed. Your health care provider may ask you:  How you are feeling.  If you are feeling the baby move.  If you have had any abnormal symptoms, such as leaking fluid, bleeding, severe headaches, or abdominal cramping.  If you are using any tobacco products, including cigarettes, chewing tobacco, and electronic cigarettes.  If you have  any questions. Other tests that may be performed during your second trimester include:  Blood tests that check for: ? Low iron levels (anemia). ? High blood sugar that affects pregnant women (gestational diabetes) between 45 and 28 weeks. ? Rh antibodies. This is to check for a protein on red blood cells (Rh factor).  Urine tests to check for infections, diabetes, or protein in the urine.  An ultrasound to confirm the proper growth and development of the baby.  An amniocentesis to check for possible genetic problems.  Fetal screens for spina bifida and Down syndrome.  HIV (human immunodeficiency virus) testing. Routine prenatal testing includes screening for HIV, unless you choose not to have this test. Follow these instructions at home: Medicines  Follow your health care provider's instructions regarding medicine use. Specific medicines may be either safe or unsafe to take during pregnancy.  Take a prenatal vitamin that contains at least 600 micrograms (mcg) of folic acid.  If you develop constipation, try taking a stool softener if your health care provider approves. Eating and drinking   Eat a balanced diet that includes fresh fruits and vegetables, whole grains, good sources of protein such as meat, eggs, or tofu, and low-fat dairy. Your health care provider will help you determine the  amount of weight gain that is right for you.  Avoid raw meat and uncooked cheese. These carry germs that can cause birth defects in the baby.  If you have low calcium intake from food, talk to your health care provider about whether you should take a daily calcium supplement.  Limit foods that are high in fat and processed sugars, such as fried and sweet foods.  To prevent constipation: ? Drink enough fluid to keep your urine clear or pale yellow. ? Eat foods that are high in fiber, such as fresh fruits and vegetables, whole grains, and beans. Activity  Exercise only as directed by your  health care provider. Most women can continue their usual exercise routine during pregnancy. Try to exercise for 30 minutes at least 5 days a week. Stop exercising if you experience uterine contractions.  Avoid heavy lifting, wear low heel shoes, and practice good posture.  A sexual relationship may be continued unless your health care provider directs you otherwise. Relieving pain and discomfort  Wear a good support bra to prevent discomfort from breast tenderness.  Take warm sitz baths to soothe any pain or discomfort caused by hemorrhoids. Use hemorrhoid cream if your health care provider approves.  Rest with your legs elevated if you have leg cramps or low back pain.  If you develop varicose veins, wear support hose. Elevate your feet for 15 minutes, 3-4 times a day. Limit salt in your diet. Prenatal Care  Write down your questions. Take them to your prenatal visits.  Keep all your prenatal visits as told by your health care provider. This is important. Safety  Wear your seat belt at all times when driving.  Make a list of emergency phone numbers, including numbers for family, friends, the hospital, and police and fire departments. General instructions  Ask your health care provider for a referral to a local prenatal education class. Begin classes no later than the beginning of month 6 of your pregnancy.  Ask for help if you have counseling or nutritional needs during pregnancy. Your health care provider can offer advice or refer you to specialists for help with various needs.  Do not use hot tubs, steam rooms, or saunas.  Do not douche or use tampons or scented sanitary pads.  Do not cross your legs for long periods of time.  Avoid cat litter boxes and soil used by cats. These carry germs that can cause birth defects in the baby and possibly loss of the fetus by miscarriage or stillbirth.  Avoid all smoking, herbs, alcohol, and unprescribed drugs. Chemicals in these  products can affect the formation and growth of the baby.  Do not use any products that contain nicotine or tobacco, such as cigarettes and e-cigarettes. If you need help quitting, ask your health care provider.  Visit your dentist if you have not gone yet during your pregnancy. Use a soft toothbrush to brush your teeth and be gentle when you floss. Contact a health care provider if:  You have dizziness.  You have mild pelvic cramps, pelvic pressure, or nagging pain in the abdominal area.  You have persistent nausea, vomiting, or diarrhea.  You have a bad smelling vaginal discharge.  You have pain when you urinate. Get help right away if:  You have a fever.  You are leaking fluid from your vagina.  You have spotting or bleeding from your vagina.  You have severe abdominal cramping or pain.  You have rapid weight gain or weight loss.  You have shortness of breath with chest pain.  You notice sudden or extreme swelling of your face, hands, ankles, feet, or legs.  You have not felt your baby move in over an hour.  You have severe headaches that do not go away when you take medicine.  You have vision changes. Summary  The second trimester is from week 14 through week 27 (months 4 through 6). It is also a time when the fetus is growing rapidly.  Your body goes through many changes during pregnancy. The changes vary from woman to woman.  Avoid all smoking, herbs, alcohol, and unprescribed drugs. These chemicals affect the formation and growth your baby.  Do not use any tobacco products, such as cigarettes, chewing tobacco, and e-cigarettes. If you need help quitting, ask your health care provider.  Contact your health care provider if you have any questions. Keep all prenatal visits as told by your health care provider. This is important. This information is not intended to replace advice given to you by your health care provider. Make sure you discuss any questions you have  with your health care provider. Document Released: 02/04/2001 Document Revised: 06/04/2018 Document Reviewed: 03/18/2016 Elsevier Patient Education  2020 [employer]  Preterm Labor and Birth Information  The normal length of a pregnancy is 39-41 weeks. Preterm labor is when labor starts before 37 completed weeks of pregnancy. What are the risk factors for preterm labor? Preterm labor is more likely to occur in women who:  Have certain infections during pregnancy such as a bladder infection, sexually transmitted infection, or infection inside the uterus (chorioamnionitis).  Have a shorter-than-normal cervix.  Have gone into preterm labor before.  Have had surgery on their cervix.  Are younger than age 21 or older than age 21.  Are African American.  Are pregnant with twins or multiple babies (multiple gestation).  Take street drugs or smoke while pregnant.  Do not gain enough weight while pregnant.  Became pregnant shortly after having been pregnant. What are the symptoms of preterm labor? Symptoms of preterm labor include:  Cramps similar to those that can happen during a menstrual period. The cramps may happen with diarrhea.  Pain in the abdomen or lower back.  Regular uterine contractions that may feel like tightening of the abdomen.  A feeling of increased pressure in the pelvis.  Increased watery or bloody mucus discharge from the vagina.  Water breaking (ruptured amniotic sac). Why is it important to recognize signs of preterm labor? It is important to recognize signs of preterm labor because babies who are born prematurely may not be fully developed. This can put them at an increased risk for:  Long-term (chronic) heart and lung problems.  Difficulty immediately after birth with regulating body systems, including blood sugar, body temperature, heart rate, and breathing rate.  Bleeding in the brain.  Cerebral palsy.  Learning difficulties.  Death.  These risks are highest for babies who are born before 34 weeks of pregnancy. How is preterm labor treated? Treatment depends on the length of your pregnancy, your condition, and the health of your baby. It may involve:  Having a stitch (suture) placed in your cervix to prevent your cervix from opening too early (cerclage).  Taking or being given medicines, such as: ? Hormone medicines. These may be given early in pregnancy to help support the pregnancy. ? Medicine to stop contractions. ? Medicines to help mature the baby's lungs. These may be prescribed if the risk of delivery is  high. ? Medicines to prevent your baby from developing cerebral palsy. If the labor happens before 34 weeks of pregnancy, you may need to stay in the hospital. What should I do if I think I am in preterm labor? If you think that you are going into preterm labor, call your health care provider right away. How can I prevent preterm labor in future pregnancies? To increase your chance of having a full-term pregnancy:  Do not use any tobacco products, such as cigarettes, chewing tobacco, and e-cigarettes. If you need help quitting, ask your health care provider.  Do not use street drugs or medicines that have not been prescribed to you during your pregnancy.  Talk with your health care provider before taking any herbal supplements, even if you have been taking them regularly.  Make sure you gain a healthy amount of weight during your pregnancy.  Watch for infection. If you think that you might have an infection, get it checked right away.  Make sure to tell your health care provider if you have gone into preterm labor before. This information is not intended to replace advice given to you by your health care provider. Make sure you discuss any questions you have with your health care provider. Document Released: 05/03/2003 Document Revised: 06/04/2018 Document Reviewed: 07/04/2015 Elsevier Patient Education   2020 [employer].

## 2018-12-21 ENCOUNTER — Encounter: Payer: Self-pay | Admitting: Women's Health

## 2018-12-21 DIAGNOSIS — R748 Abnormal levels of other serum enzymes: Secondary | ICD-10-CM | POA: Insufficient documentation

## 2018-12-21 LAB — COMPREHENSIVE METABOLIC PANEL
ALT: 34 IU/L — ABNORMAL HIGH (ref 0–32)
AST: 28 IU/L (ref 0–40)
Albumin/Globulin Ratio: 1.3 (ref 1.2–2.2)
Albumin: 3.9 g/dL (ref 3.9–5.0)
Alkaline Phosphatase: 79 IU/L (ref 39–117)
BUN/Creatinine Ratio: 6 — ABNORMAL LOW (ref 9–23)
BUN: 4 mg/dL — ABNORMAL LOW (ref 6–20)
Bilirubin Total: 0.5 mg/dL (ref 0.0–1.2)
CO2: 17 mmol/L — ABNORMAL LOW (ref 20–29)
Calcium: 9.1 mg/dL (ref 8.7–10.2)
Chloride: 103 mmol/L (ref 96–106)
Creatinine, Ser: 0.62 mg/dL (ref 0.57–1.00)
GFR calc Af Amer: 149 mL/min/{1.73_m2} (ref >59)
GFR calc non Af Amer: 129 mL/min/{1.73_m2} (ref >59)
Globulin, Total: 2.9 g/dL (ref 1.5–4.5)
Glucose: 63 mg/dL — ABNORMAL LOW (ref 65–99)
Potassium: 3.9 mmol/L (ref 3.5–5.2)
Sodium: 136 mmol/L (ref 134–144)
Total Protein: 6.8 g/dL (ref 6.0–8.5)

## 2018-12-21 LAB — OBSTETRIC PANEL, INCLUDING HIV
Antibody Screen: NEGATIVE
Basophils Absolute: 0.1 10*3/uL (ref 0.0–0.2)
Basos: 0 %
EOS (ABSOLUTE): 0.2 10*3/uL (ref 0.0–0.4)
Eos: 1 %
HIV Screen 4th Generation wRfx: NONREACTIVE
Hematocrit: 36.3 % (ref 34.0–46.6)
Hemoglobin: 11.6 g/dL (ref 11.1–15.9)
Hepatitis B Surface Ag: NEGATIVE
Immature Grans (Abs): 0.2 10*3/uL — ABNORMAL HIGH (ref 0.0–0.1)
Immature Granulocytes: 1 %
Lymphocytes Absolute: 1.7 10*3/uL (ref 0.7–3.1)
Lymphs: 14 %
MCH: 26.1 pg — ABNORMAL LOW (ref 26.6–33.0)
MCHC: 32 g/dL (ref 31.5–35.7)
MCV: 82 fL (ref 79–97)
Monocytes Absolute: 1.1 10*3/uL — ABNORMAL HIGH (ref 0.1–0.9)
Monocytes: 9 %
Neutrophils Absolute: 9.2 10*3/uL — ABNORMAL HIGH (ref 1.4–7.0)
Neutrophils: 75 %
Platelets: 274 10*3/uL (ref 150–450)
RBC: 4.45 x10E6/uL (ref 3.77–5.28)
RDW: 13.9 % (ref 11.7–15.4)
RPR Ser Ql: NONREACTIVE
Rh Factor: POSITIVE
Rubella Antibodies, IGG: 9.86 index (ref >0.99)
WBC: 12.4 10*3/uL — ABNORMAL HIGH (ref 3.4–10.8)

## 2018-12-21 LAB — PROTEIN / CREATININE RATIO, URINE
Creatinine, Urine: 134.7 mg/dL
Protein, Ur: 8.5 mg/dL
Protein/Creat Ratio: 63 mg/g creat (ref 0–200)

## 2018-12-21 LAB — URINE CULTURE

## 2018-12-22 LAB — AFP, SERUM, OPEN SPINA BIFIDA
AFP MoM: 1.06
AFP Value: 85.2 ng/mL
Gest. Age on Collection Date: 21.3 weeks
Maternal Age At EDD: 21.7 yr
OSBR Risk 1 IN: 10000
Test Results:: NEGATIVE
Weight: 133 [lb_av]

## 2018-12-22 LAB — CYTOLOGY - PAP
Chlamydia: NEGATIVE
Comment: NEGATIVE
Comment: NEGATIVE
Comment: NORMAL
Diagnosis: NEGATIVE
Neisseria Gonorrhea: NEGATIVE
Trichomonas: NEGATIVE

## 2018-12-28 ENCOUNTER — Other Ambulatory Visit: Payer: Self-pay | Admitting: Family Medicine

## 2018-12-28 DIAGNOSIS — Z3491 Encounter for supervision of normal pregnancy, unspecified, first trimester: Secondary | ICD-10-CM

## 2018-12-30 ENCOUNTER — Encounter: Payer: Self-pay | Admitting: *Deleted

## 2018-12-31 ENCOUNTER — Ambulatory Visit (HOSPITAL_COMMUNITY)
Admission: RE | Admit: 2018-12-31 | Discharge: 2018-12-31 | Disposition: A | Discharge status: Home / Self Care | Payer: Self-pay | Source: Ambulatory Visit | Attending: Obstetrics and Gynecology | Admitting: Obstetrics and Gynecology

## 2018-12-31 ENCOUNTER — Other Ambulatory Visit (HOSPITAL_COMMUNITY): Payer: Self-pay | Admitting: *Deleted

## 2018-12-31 ENCOUNTER — Other Ambulatory Visit: Payer: Self-pay | Admitting: Women's Health

## 2018-12-31 ENCOUNTER — Other Ambulatory Visit: Payer: Self-pay

## 2018-12-31 DIAGNOSIS — Z3492 Encounter for supervision of normal pregnancy, unspecified, second trimester: Secondary | ICD-10-CM | POA: Insufficient documentation

## 2018-12-31 DIAGNOSIS — O359XX0 Maternal care for (suspected) fetal abnormality and damage, unspecified, not applicable or unspecified: Secondary | ICD-10-CM

## 2018-12-31 DIAGNOSIS — Z362 Encounter for other antenatal screening follow-up: Secondary | ICD-10-CM

## 2018-12-31 DIAGNOSIS — Z3A23 23 weeks gestation of pregnancy: Secondary | ICD-10-CM

## 2019-01-05 ENCOUNTER — Encounter: Payer: Self-pay | Admitting: Women's Health

## 2019-01-05 DIAGNOSIS — O359XX0 Maternal care for (suspected) fetal abnormality and damage, unspecified, not applicable or unspecified: Secondary | ICD-10-CM | POA: Insufficient documentation

## 2019-01-14 ENCOUNTER — Telehealth: Payer: Self-pay

## 2019-01-14 ENCOUNTER — Encounter: Payer: Self-pay | Admitting: Family Medicine

## 2019-01-14 DIAGNOSIS — D563 Thalassemia minor: Secondary | ICD-10-CM | POA: Insufficient documentation

## 2019-01-14 NOTE — Telephone Encounter (Signed)
Called Pt to advise that she's a Silent Carrier for Alpha-Thalassemia, gave # to Pt to call for Genetic counseling. Pt verbalized understanding

## 2019-01-17 ENCOUNTER — Telehealth (INDEPENDENT_AMBULATORY_CARE_PROVIDER_SITE_OTHER): Payer: Self-pay | Admitting: Student

## 2019-01-17 ENCOUNTER — Other Ambulatory Visit: Payer: Self-pay

## 2019-01-17 DIAGNOSIS — R748 Abnormal levels of other serum enzymes: Secondary | ICD-10-CM

## 2019-01-17 DIAGNOSIS — O99112 Other diseases of the blood and blood-forming organs and certain disorders involving the immune mechanism complicating pregnancy, second trimester: Secondary | ICD-10-CM

## 2019-01-17 DIAGNOSIS — Z3493 Encounter for supervision of normal pregnancy, unspecified, third trimester: Secondary | ICD-10-CM

## 2019-01-17 DIAGNOSIS — O359XX0 Maternal care for (suspected) fetal abnormality and damage, unspecified, not applicable or unspecified: Secondary | ICD-10-CM

## 2019-01-17 DIAGNOSIS — Z3A25 25 weeks gestation of pregnancy: Secondary | ICD-10-CM

## 2019-01-17 DIAGNOSIS — D563 Thalassemia minor: Secondary | ICD-10-CM

## 2019-01-17 NOTE — Progress Notes (Signed)
I connected with  Urban Gibson on 01/17/19 at 10:15 AM EST by telephone and verified that I am speaking with the correct person using two identifiers.   I discussed the limitations, risks, security and privacy concerns of performing an evaluation and management service by telephone and the availability of in person appointments. I also discussed with the patient that there may be a patient responsible charge related to this service. The patient expressed understanding and agreed to proceed.  Bethanne Ginger, CMA 01/17/2019  10:37 AM

## 2019-01-17 NOTE — Patient Instructions (Signed)
  Glucose Tolerance Test Why am I having this test? The glucose tolerance test (GTT) is done to check how your body processes sugar (glucose). This is one of several tests used to diagnose diabetes (diabetes mellitus). Your health care provider may recommend this test if you:  Have a family history of diabetes.  Are very overweight (obese).  Have infections that keep coming back (recurring).  Have had a lot of wounds that did not heal quickly, especially on your legs and feet.  Are a woman and have a history of giving birth to very large babies or a history of repeated fetal loss (stillbirth).  Have had high glucose levels in your urine or blood: ? During a past pregnancy. ? After a heart attack, surgery, or prolonged periods of high stress. What is being tested? This test measures the amount of glucose in your blood at different times during a period of 2 hours. This indicates how well your body is able to process glucose. What kind of sample is taken?  Blood samples are required for this test. They are usually collected by inserting a needle into a blood vessel. How do I prepare for this test?  For 3 days before your test, eat normally. Have plenty of carbohydrate-rich foods.  Follow instructions from your health care provider about: ? Eating or drinking restrictions on the day of the test. You may be asked to not eat or drink anything other than water (fast) starting 8-12 hours before the test. ? Changing or stopping your regular medicines. Some medicines may interfere with this test. Tell a health care provider about:  All medicines you are taking, including vitamins, herbs, eye drops, creams, and over-the-counter medicines.  Any blood disorders you have.  Any surgeries you have had.  Any medical conditions you have.  Whether you are pregnant or may be pregnant. What happens during the test? First, your blood glucose will be measured. This is referred to as your  fasting blood glucose, since you fasted before the test. Then, you will drink a glucose solution that contains a certain amount of glucose. Your blood glucose will be measured again 1 and 2 hours after drinking the solution. This test takes 2 hours to complete. You will need to stay at the testing location during this time. During the testing period:  Do not eat or drink anything other than the glucose solution. You will be allowed to drink water.  Do not exercise.  Do not use any products that contain nicotine or tobacco, such as cigarettes and e-cigarettes. If you need help stopping, ask your health care provider. The testing procedure may vary among health care providers and hospitals. How are the results reported? Your results will be reported as milligrams of glucose per deciliter of blood (mg/dL) or millimoles per liter (mmol/L). Your health care provider will compare your results to normal ranges that were established after testing a large group of people (reference ranges). Reference ranges may vary among labs and hospitals. For this test, common reference ranges are:  Fasting: less than 110 mg/dL (6.1 mmol/L).  1 hour after drinking glucose: less than 180 mg/dL (10.0 mmol/L).  2 hours after drinking glucose: less than 140 mg/dL (7.8 mmol/L). What do the results mean? Results that are within the reference ranges are considered normal, meaning that your glucose levels are well controlled. Results higher than the reference ranges may mean that you recently experienced stress, such as from an injury or a sudden (acute) condition   like a heart attack or stroke, or that you have:  Diabetes.  Cushing syndrome.  Tumors such as pheochromocytoma or glucagonoma.  Kidney failure.  Pancreatitis.  Hyperthyroidism.  An infection. Talk with your health care provider about what your results mean. Questions to ask your health care provider Ask your health care provider, or the department  that is doing the test:  When will my results be ready?  How will I get my results?  What are my treatment options?  What other tests do I need?  What are my next steps? Summary  The glucose tolerance test (GTT) is done to check how your body processes sugar (glucose). This is one of several tests used to diagnose diabetes (diabetes mellitus).  This test measures the amount of glucose in your blood at different times during a period of 2 hours. This indicates how well your body is able to process glucose.  Talk with your health care provider about what your results mean. This information is not intended to replace advice given to you by your health care provider. Make sure you discuss any questions you have with your health care provider. Document Released: 03/05/2004 Document Revised: 01/23/2017 Document Reviewed: 09/22/2016 Elsevier Patient Education  2020 Elsevier Inc.    

## 2019-01-17 NOTE — Progress Notes (Signed)
Patient ID: Allison Robles, female   DOB: [birth year], 23 y.o.   MRN: [id number] I connected with@ on 01/17/19 at 10:15 AM EST by: My Chart  and verified that I am speaking with the correct person using two identifiers.  Patient is located at home and provider is located at home     The purpose of this virtual visit is to provide medical care while limiting exposure to the novel coronavirus. I discussed the limitations, risks, security and privacy concerns of performing an evaluation and management service by My Chart and the availability of in person appointments. I also discussed with the patient that there may be a patient responsible charge related to this service. By engaging in this virtual visit, you consent to the provision of healthcare.  Additionally, you authorize for your insurance to be billed for the services provided during this visit.  The patient expressed understanding and agreed to proceed.  The following staff members participated in the virtual visit:  Pam neal    PRENATAL VISIT NOTE  Subjective:  Allison Robles is a 23 y.o. G1P0000 at [redacted]w[redacted]d  for phone visit for ongoing prenatal care.  She is currently monitored for the following issues for this low-risk pregnancy and has Supervision of low-risk pregnancy; Elevated liver enzymes; Fetal abnormality during pregnancy, antepartum; and Alpha thalassemia silent carrier on their problem list.  Patient reports no complaints.  Contractions: Not present. Vag. Bleeding: None.  Movement: Present. Denies leaking of fluid.   The following portions of the patient's history were reviewed and updated as appropriate: allergies, current medications, past family history, past medical history, past social history, past surgical history and problem list.   Objective:  There were no vitals filed for this visit. Self-Obtained  Fetal Status:     Movement: Present     Assessment and Plan:  Pregnancy: G1P0000 at [redacted]w[redacted]d 1. Alpha thalassemia  silent carrier Patient will call about genetic testing  2. Fetal abnormality during pregnancy, antepartum, single or unspecified fetus Has follow up US scheduled for january  3. Elevated liver enzymes Will redraw CMP at 28 week visit with 2 hour GTT and 28 week labs.   4. Encounter for supervision of low-risk pregnancy in third trimester -Doing well; CMA called Medicaid and patient is not eligible for cuff; will have patient purchase her own.  -My Chart message sent to notify patient that she needs to get her cuff and warning levels for blood pressures, as well as what to do if its elevated.  -Anticipatory guidance given for 2 hour GTT next visit.   Preterm labor symptoms and general obstetric precautions including but not limited to vaginal bleeding, contractions, leaking of fluid and fetal movement were reviewed in detail with the patient.  No follow-ups on file.  Future Appointments  Date Time Provider [place]  02/28/2019 10:15 AM [place] [facility]  02/28/2019 10:15 AM [place] [facility]     Time spent on virtual visit: 13 minutes  Starr [place], CNM

## 2019-02-07 ENCOUNTER — Other Ambulatory Visit: Payer: Self-pay | Admitting: Student

## 2019-02-07 ENCOUNTER — Other Ambulatory Visit: Payer: Self-pay | Admitting: *Deleted

## 2019-02-07 DIAGNOSIS — R748 Abnormal levels of other serum enzymes: Secondary | ICD-10-CM

## 2019-02-07 DIAGNOSIS — Z3490 Encounter for supervision of normal pregnancy, unspecified, unspecified trimester: Secondary | ICD-10-CM

## 2019-02-08 ENCOUNTER — Telehealth: Payer: Self-pay

## 2019-02-08 ENCOUNTER — Ambulatory Visit (INDEPENDENT_AMBULATORY_CARE_PROVIDER_SITE_OTHER): Payer: Self-pay | Admitting: Student

## 2019-02-08 ENCOUNTER — Other Ambulatory Visit: Payer: Medicaid Other

## 2019-02-08 ENCOUNTER — Other Ambulatory Visit: Payer: Self-pay

## 2019-02-08 DIAGNOSIS — Z23 Encounter for immunization: Secondary | ICD-10-CM

## 2019-02-08 DIAGNOSIS — Z3A28 28 weeks gestation of pregnancy: Secondary | ICD-10-CM

## 2019-02-08 DIAGNOSIS — R748 Abnormal levels of other serum enzymes: Secondary | ICD-10-CM

## 2019-02-08 DIAGNOSIS — Z3493 Encounter for supervision of normal pregnancy, unspecified, third trimester: Secondary | ICD-10-CM

## 2019-02-08 DIAGNOSIS — Z3490 Encounter for supervision of normal pregnancy, unspecified, unspecified trimester: Secondary | ICD-10-CM

## 2019-02-08 MED ORDER — PRENATAL VITAMIN 27-0.8 MG PO TABS: 1.0000 | ORAL_TABLET | Freq: Every day | ORAL | 1 refills | Status: DC

## 2019-02-08 NOTE — Telephone Encounter (Signed)
Called pt to advise information from [place] regarding BP Cuff not being covered by [facility] [employer] Planning, Pt understood and said was given info about purchasing a BP Cuff on [place].

## 2019-02-08 NOTE — Patient Instructions (Signed)
How to Take Your Blood Pressure You can take your blood pressure at home with a machine. You may need to check your blood pressure at home:  To check if you have high blood pressure (hypertension).  To check your blood pressure over time.  To make sure your blood pressure medicine is working. Supplies needed: You will need a blood pressure machine, or monitor. You can buy one at a drugstore or online. When choosing one:  Choose one with an arm cuff.  Choose one that wraps around your upper arm. Only one finger should fit between your arm and the cuff.  Do not choose one that measures your blood pressure from your wrist or finger. Your doctor can suggest a monitor. How to prepare Avoid these things for 30 minutes before checking your blood pressure:  Drinking caffeine.  Drinking alcohol.  Eating.  Smoking.  Exercising. Five minutes before checking your blood pressure:  Pee.  Sit in a dining chair. Avoid sitting in a soft couch or armchair.  Be quiet. Do not talk. How to take your blood pressure Follow the instructions that came with your machine. If you have a digital blood pressure monitor, these may be the instructions: 1. Sit up straight. 2. Place your feet on the floor. Do not cross your ankles or legs. 3. Rest your left arm at the level of your heart. You may rest it on a table, desk, or chair. 4. Pull up your shirt sleeve. 5. Wrap the blood pressure cuff around the upper part of your left arm. The cuff should be 1 inch (2.5 cm) above your elbow. It is best to wrap the cuff around bare skin. 6. Fit the cuff snugly around your arm. You should be able to place only one finger between the cuff and your arm. 7. Put the cord inside the groove of your elbow. 8. Press the power button. 9. Sit quietly while the cuff fills with air and loses air. 10. Write down the numbers on the screen. 11. Wait 2-3 minutes and then repeat steps 1-10. What do the numbers mean? Two  numbers make up your blood pressure. The first number is called systolic pressure. The second is called diastolic pressure. An example of a blood pressure reading is "120 over 80" (or 120/80). If you are an adult and do not have a medical condition, use this guide to find out if your blood pressure is normal: Normal  First number: below 120.  Second number: below 80. Elevated  First number: 120-129.  Second number: below 80. Hypertension stage 1  First number: 130-139.  Second number: 80-89. Hypertension stage 2  First number: 140 or above.  Second number: 90 or above. Your blood pressure is above normal even if only the top or bottom number is above normal. Follow these instructions at home:  Check your blood pressure as often as your doctor tells you to.  Take your monitor to your next doctor's appointment. Your doctor will: ? Make sure you are using it correctly. ? Make sure it is working right.  Make sure you understand what your blood pressure numbers should be.  Tell your doctor if your medicines are causing side effects. Contact a doctor if:  Your blood pressure keeps being high. Get help right away if:  Your first blood pressure number is higher than 180.  Your second blood pressure number is higher than 120. This information is not intended to replace advice given to you by your health   care provider. Make sure you discuss any questions you have with your health care provider. Document Released: 01/24/2008 Document Revised: 01/23/2017 Document Reviewed: 07/20/2015 Elsevier Patient Education  2020 Elsevier Inc.  

## 2019-02-08 NOTE — Telephone Encounter (Signed)
Pt advised that she still has not rcvd BP Cuff, called [facility], per Christy Sartorius ,pt has Medicaid Family Planning and they will not cover BP Cuff. Pt will need to either purchase her own or she will need to do face to face visits.

## 2019-02-08 NOTE — Progress Notes (Signed)
   PRENATAL VISIT NOTE  Subjective:  Allison Robles is a 23 y.o. G1P0000 at 57w4dbeing seen today for ongoing prenatal care.  She is currently monitored for the following issues for this low-risk pregnancy and has Supervision of low-risk pregnancy; Elevated liver enzymes; Fetal abnormality during pregnancy, antepartum; and Alpha thalassemia silent carrier on their problem list.  Patient reports no complaints. Some questions about fetal movements and positions.   Contractions: Not present. Vag. Bleeding: None.  Movement: Present. Denies leaking of fluid.   The following portions of the patient's history were reviewed and updated as appropriate: allergies, current medications, past family history, past medical history, past social history, past surgical history and problem list.   Objective:   Vitals:   02/08/19 0927  BP: 119/85  Pulse: 76  Weight: 143 lb 4.8 oz (65 kg)    Fetal Status: Fetal Heart Rate (bpm): 149 Fundal Height: 28 cm Movement: Present     General:  Alert, oriented and cooperative. Patient is in no acute distress.  Skin: Skin is warm and dry. No rash noted.   Cardiovascular: Normal heart rate noted  Respiratory: Normal respiratory effort, no problems with respiration noted  Abdomen: Soft, gravid, appropriate for gestational age.  Pain/Pressure: Present     Pelvic: Cervical exam deferred        Extremities: Normal range of motion.  Edema: Trace  Mental Status: Normal mood and affect. Normal behavior. Normal judgment and thought content.   Assessment and Plan:  Pregnancy: G1P0000 at 279w4d. Elevated liver enzymes  - Comp Met (CMET) - Hepatic function panel  2. Encounter for supervision of low-risk pregnancy, antepartum -Patient doing well; questions answered about normal aches and pains of pregnancy -Refill given on prenatal vitamin -Patient ate this morning so she will have a 1 hour GTT instead. -[place]  need to purchase her own BP cuff, which she agrees to do. Reviewed with patient the importance of getting a cuff and the BP values and symptoms we are concerned about. Patient has BabyRx and knows that she will need to enter her blood pressure readings into BabyRX. I also requested that patient get a nurse call in two weeks to make sure that patient has purchased her cuff and that she will is using it properly.     -She plans to keep follow up [place] January for Bilateral UTD  - CBC - HIV antibody - RPR - Glucose Tolerance, 1 Hour - Tdap vaccine greater than or equal to 7yo IM  Preterm labor symptoms and general obstetric precautions including but not limited to vaginal bleeding, contractions, leaking of fluid and fetal movement were reviewed in detail with the patient. Please refer to After Visit Summary for other counseling recommendations.   Return 2 weeks for for nurse call to check that she is using BP cuff and then 4 weeks for my chart LROB.  Future Appointments  Date Time Provider [place]12/29/2020  1:30 PM W[place]1/05/2019 10:15 AM WH-MFC NURSE [place]  02/28/2019 10:15 AM WH-MFC [place] WH-MFCUS MFC-US  03/08/2019  9:15 AM KoArdean LarsenKaMervyn SkeetersCNM WOC-WOCA [place]

## 2019-02-09 LAB — HEPATIC FUNCTION PANEL
ALT: 16 IU/L (ref 0–32)
AST: 18 IU/L (ref 0–40)
Albumin: 3.9 g/dL (ref 3.9–5.0)
Alkaline Phosphatase: 96 IU/L (ref 39–117)
Bilirubin Total: 0.3 mg/dL (ref 0.0–1.2)
Bilirubin, Direct: 0.09 mg/dL (ref 0.00–0.40)
Total Protein: 6.3 g/dL (ref 6.0–8.5)

## 2019-02-09 LAB — CBC
Hematocrit: 32.7 % — ABNORMAL LOW (ref 34.0–46.6)
Hemoglobin: 10.6 g/dL — ABNORMAL LOW (ref 11.1–15.9)
MCH: 26.1 pg — ABNORMAL LOW (ref 26.6–33.0)
MCHC: 32.4 g/dL (ref 31.5–35.7)
MCV: 81 fL (ref 79–97)
Platelets: 273 10*3/uL (ref 150–450)
RBC: 4.06 x10E6/uL (ref 3.77–5.28)
RDW: 13 % (ref 11.7–15.4)
WBC: 11 10*3/uL — ABNORMAL HIGH (ref 3.4–10.8)

## 2019-02-09 LAB — COMPREHENSIVE METABOLIC PANEL
ALT: 18 IU/L (ref 0–32)
AST: 18 IU/L (ref 0–40)
Albumin/Globulin Ratio: 1.4 (ref 1.2–2.2)
Albumin: 3.8 g/dL — ABNORMAL LOW (ref 3.9–5.0)
Alkaline Phosphatase: 97 IU/L (ref 39–117)
BUN/Creatinine Ratio: 8 — ABNORMAL LOW (ref 9–23)
BUN: 5 mg/dL — ABNORMAL LOW (ref 6–20)
Bilirubin Total: 0.4 mg/dL (ref 0.0–1.2)
CO2: 20 mmol/L (ref 20–29)
Calcium: 9 mg/dL (ref 8.7–10.2)
Chloride: 102 mmol/L (ref 96–106)
Creatinine, Ser: 0.62 mg/dL (ref 0.57–1.00)
GFR calc Af Amer: 149 mL/min/{1.73_m2} (ref >59)
GFR calc non Af Amer: 129 mL/min/{1.73_m2} (ref >59)
Globulin, Total: 2.7 g/dL (ref 1.5–4.5)
Glucose: 79 mg/dL (ref 65–99)
Potassium: 3.8 mmol/L (ref 3.5–5.2)
Sodium: 136 mmol/L (ref 134–144)
Total Protein: 6.5 g/dL (ref 6.0–8.5)

## 2019-02-09 LAB — HIV ANTIBODY (ROUTINE TESTING W REFLEX): HIV Screen 4th Generation wRfx: NONREACTIVE

## 2019-02-09 LAB — GLUCOSE TOLERANCE, 1 HOUR: Glucose, 1Hr PP: 75 mg/dL (ref 65–199)

## 2019-02-09 LAB — RPR: RPR Ser Ql: NONREACTIVE

## 2019-02-14 ENCOUNTER — Other Ambulatory Visit: Payer: Medicaid Other

## 2019-02-22 ENCOUNTER — Telehealth (INDEPENDENT_AMBULATORY_CARE_PROVIDER_SITE_OTHER): Payer: Self-pay

## 2019-02-22 ENCOUNTER — Other Ambulatory Visit: Payer: Self-pay

## 2019-02-22 DIAGNOSIS — Z0130 Encounter for examination of blood pressure without abnormal findings: Secondary | ICD-10-CM

## 2019-02-22 NOTE — Progress Notes (Signed)
Called home number at 1318; call could not be completed. Called mobile number at 1319; VM left stating purpose of call and that I will call again at 1330. MyChart text invitation sent to mobile number.  I connected with  Allison Robles on 02/22/19 at 1330 by MyChart and verified that I am speaking with the correct person using two identifiers.  Visit today is to follow up that pt is using BP cuff. Per Babyscripts review: BP 02/14/19 was 120/68 and 02/18/19 was 113/66. Pt states she will check BP again today when she is at home and log into Babyscripts; pt not at home at this time. Encouraged pt to continue taking BP once a week. Reviewed normal BP parameters and s/s of elevated BP.   Annabell Howells, RN 02/22/2019  1:30 PM

## 2019-02-25 NOTE — L&D Delivery Note (Signed)
Patient is a 24 y.o. now G1P1 s/p NSVD at [redacted]w[redacted]d, who was admitted for latent labor and fetal decelerations in MAU.  She progressed with augmentation (pitocin) to complete and pushed 9 minutes to deliver. Head and shoulders delivered atraumatically. Spontaneous cry on delivery. Cord clamping delayed by several minutes then clamped by CNM and cut by FOB. NICU team at bedside d/t moderate meconium and fetal decelerations.   Placenta intact and spontaneous, bleeding minimal. Mom and baby stable prior to transfer to postpartum. She plans on breastfeeding. She requests OCPs for birth control.  Delivery Note At 12:18 PM a viable and healthy female was delivered via Vaginal, Spontaneous (Presentation: OA ).  APGAR: 8, 9; weight 7 lb 5.3 oz (3325 g).   Placenta status: Spontaneous;Pathology, Intact.  Cord: 3 vessels with the following complications: None.    Anesthesia: Epidural Episiotomy: None Lacerations: None Suture Repair: none Est. Blood Loss (mL): 100  Mom to postpartum.  Baby to Couplet care / Skin to Skin.  Sharyon Cable CNM 04/27/2019, 12:42 PM

## 2019-02-28 ENCOUNTER — Other Ambulatory Visit (HOSPITAL_COMMUNITY): Payer: Self-pay | Admitting: *Deleted

## 2019-02-28 ENCOUNTER — Encounter (HOSPITAL_COMMUNITY): Payer: Self-pay | Admitting: *Deleted

## 2019-02-28 ENCOUNTER — Ambulatory Visit (HOSPITAL_COMMUNITY): Payer: Medicaid Other | Admitting: *Deleted

## 2019-02-28 ENCOUNTER — Ambulatory Visit (HOSPITAL_COMMUNITY)
Admission: RE | Admit: 2019-02-28 | Discharge: 2019-02-28 | Disposition: A | Discharge status: Home / Self Care | Payer: Medicaid Other | Source: Ambulatory Visit | Attending: Obstetrics and Gynecology | Admitting: Obstetrics and Gynecology

## 2019-02-28 ENCOUNTER — Other Ambulatory Visit: Payer: Self-pay

## 2019-02-28 DIAGNOSIS — R748 Abnormal levels of other serum enzymes: Secondary | ICD-10-CM | POA: Insufficient documentation

## 2019-02-28 DIAGNOSIS — Z362 Encounter for other antenatal screening follow-up: Secondary | ICD-10-CM | POA: Insufficient documentation

## 2019-02-28 DIAGNOSIS — D563 Thalassemia minor: Secondary | ICD-10-CM | POA: Insufficient documentation

## 2019-02-28 DIAGNOSIS — Z3A31 31 weeks gestation of pregnancy: Secondary | ICD-10-CM

## 2019-02-28 DIAGNOSIS — O35EXX0 Maternal care for other (suspected) fetal abnormality and damage, fetal genitourinary anomalies, not applicable or unspecified: Secondary | ICD-10-CM

## 2019-02-28 DIAGNOSIS — O359XX0 Maternal care for (suspected) fetal abnormality and damage, unspecified, not applicable or unspecified: Secondary | ICD-10-CM

## 2019-02-28 DIAGNOSIS — O358XX0 Maternal care for other (suspected) fetal abnormality and damage, not applicable or unspecified: Secondary | ICD-10-CM

## 2019-03-01 NOTE — Progress Notes (Signed)
Chart reviewed for nurse visit. Agree with plan of care.   Marylene Land, CNM 03/01/2019 7:25 PM

## 2019-03-08 ENCOUNTER — Other Ambulatory Visit: Payer: Self-pay

## 2019-03-08 ENCOUNTER — Telehealth (INDEPENDENT_AMBULATORY_CARE_PROVIDER_SITE_OTHER): Payer: Self-pay | Admitting: Student

## 2019-03-08 DIAGNOSIS — Z3A26 26 weeks gestation of pregnancy: Secondary | ICD-10-CM

## 2019-03-08 DIAGNOSIS — Z3493 Encounter for supervision of normal pregnancy, unspecified, third trimester: Secondary | ICD-10-CM

## 2019-03-08 DIAGNOSIS — R748 Abnormal levels of other serum enzymes: Secondary | ICD-10-CM

## 2019-03-08 DIAGNOSIS — O359XX0 Maternal care for (suspected) fetal abnormality and damage, unspecified, not applicable or unspecified: Secondary | ICD-10-CM

## 2019-03-08 DIAGNOSIS — O26892 Other specified pregnancy related conditions, second trimester: Secondary | ICD-10-CM

## 2019-03-08 DIAGNOSIS — D563 Thalassemia minor: Secondary | ICD-10-CM

## 2019-03-08 NOTE — Progress Notes (Signed)
Patient ID: Allison Robles, female   DOB: September 08, 1995, 24 y.o.   MRN: [id number] I connected with@ on 03/08/19 at  9:15 AM EST by:  My Chart and verified that I am speaking with the correct person using two identifiers.  Patient is located at home and provider is located at [place].     The purpose of this virtual visit is to provide medical care while limiting exposure to the novel coronavirus. I discussed the limitations, risks, security and privacy concerns of performing an evaluation and management service by My Chart  and the availability of in person appointments. I also discussed with the patient that there may be a patient responsible charge related to this service. By engaging in this virtual visit, you consent to the provision of healthcare.  Additionally, you authorize for your insurance to be billed for the services provided during this visit.  The patient expressed understanding and agreed to proceed.  The following staff members participated in the virtual visit:  Maxwell Marion.     PRENATAL VISIT NOTE  Subjective:  Allison Robles is a 24 y.o. G1P0000 at [redacted]w[redacted]d  for phone visit for ongoing prenatal care.  She is currently monitored for the following issues for this low-risk pregnancy and has Supervision of low-risk pregnancy; Elevated liver enzymes; Fetal abnormality during pregnancy, antepartum; and Alpha thalassemia silent carrier on their problem list.  Patient reports no complaints. Reports lots of fetal movements.  Contractions: Irritability. Vag. Bleeding: None.  Movement: Present. Denies leaking of fluid. Patient not checking BPs regularly, but she agrees to start. States that she has been tired and keeps forgetting.   The following portions of the patient's history were reviewed and updated as appropriate: allergies, current medications, past family history, past medical history, past social history, past surgical history and problem list.   Objective:   Vitals:   03/08/19  0848  BP: 116/69  Pulse: 66   Self-Obtained  Fetal Status:     Movement: Present     Assessment and Plan:  Pregnancy: G1P0000 at [redacted]w[redacted]d 1. Alpha thalassemia silent carrier   2. Fetal abnormality during pregnancy, antepartum, single or unspecified fetus -on [place] on 1/4, left urinary tract dilation measuring 8 mm was seen but right renal pelvis was normal. Per MFM, this is a reassuring finding.   3. Elevated liver enzymes Normal on 12/15, no other future follow up needed.   4. Encounter for supervision of low-risk pregnancy in third trimester -Discussed birth control in detail, patient does not want IUD but is interested in COCPs as she does not want to breastfeed.  -Have appt in 2 weeks to make sure she is checking BP and also to continue birth control discussion; patient says that she will have "many more questions" at her coming appointments.  -Patient not checking BP Preterm labor symptoms and general obstetric precautions including but not limited to vaginal bleeding, contractions, leaking of fluid and fetal movement were reviewed in detail with the patient.  Return in about 2 weeks (around 03/22/2019), or LROB on My Chart.  Future Appointments  Date Time Provider Department Center  04/04/2019  7:40 AM WH-MFC NURSE WH-MFC MFC-US  04/04/2019  7:45 AM WH-MFC [place] 2 WH-MFCUS MFC-US     Time spent on virtual visit: 12 minutes  Marylene Land, CNM

## 2019-03-08 NOTE — Progress Notes (Signed)
I connected with  Allison Robles on 03/08/19 at 0846 by telephone and verified that I am speaking with the correct person using two identifiers.   I discussed the limitations, risks, security and privacy concerns of performing an evaluation and management service by telephone and the availability of in person appointments. I also discussed with the patient that there may be a patient responsible charge related to this service. The patient expressed understanding and agreed to proceed.  Marjo Bicker, RN 03/08/2019  8:47 AM

## 2019-03-15 ENCOUNTER — Other Ambulatory Visit: Payer: Self-pay | Admitting: Lactation Services

## 2019-03-15 MED ORDER — TERCONAZOLE 0.4 % VA CREA: 1.0000 | TOPICAL_CREAM | Freq: Every day | VAGINAL | 0 refills | Status: DC

## 2019-03-19 ENCOUNTER — Ambulatory Visit
Admission: EM | Admit: 2019-03-19 | Discharge: 2019-03-19 | Disposition: A | Discharge status: Home / Self Care | Payer: Medicaid Other | Attending: Physician Assistant | Admitting: Physician Assistant

## 2019-03-19 ENCOUNTER — Other Ambulatory Visit: Payer: Self-pay

## 2019-03-19 ENCOUNTER — Encounter: Payer: Self-pay | Admitting: Emergency Medicine

## 2019-03-19 DIAGNOSIS — R05 Cough: Secondary | ICD-10-CM

## 2019-03-19 DIAGNOSIS — Z20822 Contact with and (suspected) exposure to covid-19: Secondary | ICD-10-CM

## 2019-03-19 DIAGNOSIS — Z3A34 34 weeks gestation of pregnancy: Secondary | ICD-10-CM

## 2019-03-19 DIAGNOSIS — R059 Cough, unspecified: Secondary | ICD-10-CM

## 2019-03-19 DIAGNOSIS — R0981 Nasal congestion: Secondary | ICD-10-CM

## 2019-03-19 LAB — POC SARS CORONAVIRUS 2 AG -  ED: SARS Coronavirus 2 Ag: NEGATIVE

## 2019-03-19 NOTE — Discharge Instructions (Signed)
Rapid COVID negative. COVID PCR testing ordered. I would like you to quarantine until testing results. Flonase for nasal congestion. Saline spray, netipot can also help with symptoms. Tylenol for pain and fever. Keep hydrated, urine should be clear to pale yellow in color. If experiencing shortness of breath, trouble breathing, go to the emergency department for further evaluation needed.

## 2019-03-19 NOTE — ED Triage Notes (Signed)
Pt here for cough and congestion; pt is [redacted] weeks pregnant with prenatal care

## 2019-03-19 NOTE — ED Provider Notes (Signed)
[facility]    CSN: [id number] Arrival date & time: 03/19/19  1226      History   Chief Complaint Chief Complaint  Patient presents with  . Nasal Congestion  . URI    HPI Allison Robles is a 24 y.o. female.   24 year old female who is [redacted] weeks pregnant comes in for 2 day of URI symptoms. Cough, nasal congestion, sore throat. Denies fever, chills, body aches. States having "braxton hicks contractions" that is normal for her and OB is following. Denies nausea, vomiting, diarrhea. Has had some loss of smell and changes to taste.  Denies shortness of breath, chest pain.  Good fetal movement that has not decreased.      Past Medical History:  Diagnosis Date  . Anemia   . Medical history non-contributory     Patient Active Problem List   Diagnosis Date Noted  . Alpha thalassemia silent carrier 01/14/2019  . Fetal abnormality during pregnancy, antepartum 01/05/2019  . Elevated liver enzymes 12/21/2018  . Supervision of low-risk pregnancy 11/17/2018    Past Surgical History:  Procedure Laterality Date  . NO PAST SURGERIES      OB History    Gravida  1   Para  0   Term  0   Preterm  0   AB  0   Living  0     SAB  0   TAB  0   Ectopic  0   Multiple  0   Live Births  0            Home Medications    Prior to Admission medications   Medication Sig Start Date End Date Taking? Authorizing Provider  Blood Pressure Monitoring DEVI 1 Device by Does not apply route once a week. 12/20/18   Nugent, Odie Sera, NP  Prenatal Vit-Fe Fumarate-FA (PRENATAL VITAMIN) 27-0.8 MG TABS Take 1 tablet by mouth daily. Patient not taking: Reported on 03/08/2019 02/08/19   Marylene Land, CNM  terconazole (TERAZOL 7) 0.4 % vaginal cream Place 1 applicator vaginally at bedtime. 03/15/19   Tereso Newcomer, MD    Family History History reviewed. No pertinent family history.  Social History Social History   Tobacco Use  . Smoking status:  Current Every Day Smoker    Types: Cigars  . Smokeless tobacco: Never Used  . Tobacco comment: Pt smokes 3 black and milds a day  Substance Use Topics  . Alcohol use: No  . Drug use: Yes    Types: Marijuana     Allergies   Patient has no known allergies.   Review of Systems Review of Systems  Reason unable to perform ROS: See HPI as above.     Physical Exam Triage Vital Signs ED Triage Vitals  Enc Vitals Group     BP 03/19/19 1301 115/76     Pulse Rate 03/19/19 1301 94     Resp 03/19/19 1301 18     Temp 03/19/19 1301 97.6 F (36.4 C)     Temp Source 03/19/19 1301 Axillary     SpO2 03/19/19 1301 100 %     Weight --      Height --      Head Circumference --      Peak Flow --      Pain Score 03/19/19 1302 0     Pain Loc --      Pain Edu? --      Excl. in GC? --  No data found.  Updated Vital Signs BP 115/76 (BP Location: Right Arm)   Pulse 94   Temp 97.6 F (36.4 C) (Axillary)   Resp 18   SpO2 100%   Physical Exam Constitutional:      General: She is not in acute distress.    Appearance: Normal appearance. She is not ill-appearing, toxic-appearing or diaphoretic.  HENT:     Head: Normocephalic and atraumatic.     Nose: Congestion present. No rhinorrhea.     Right Sinus: No maxillary sinus tenderness or frontal sinus tenderness.     Left Sinus: No maxillary sinus tenderness or frontal sinus tenderness.     Mouth/Throat:     Mouth: Mucous membranes are moist.     Pharynx: Oropharynx is clear. Uvula midline.  Cardiovascular:     Rate and Rhythm: Normal rate and regular rhythm.     Heart sounds: Normal heart sounds. No murmur. No friction rub. No gallop.   Pulmonary:     Effort: Pulmonary effort is normal. No accessory muscle usage, prolonged expiration, respiratory distress or retractions.     Comments: Lungs clear to auscultation without adventitious lung sounds. Musculoskeletal:     Cervical back: Normal range of motion and neck supple.   Neurological:     General: No focal deficit present.     Mental Status: She is alert and oriented to person, place, and time.      UC Treatments / Results  Labs (all labs ordered are listed, but only abnormal results are displayed) Labs Reviewed  NOVEL CORONAVIRUS, NAA  POC SARS CORONAVIRUS 2 AG -  ED    EKG   Radiology No results found.  Procedures Procedures (including critical care time)  Medications Ordered in UC Medications - No data to display  Initial Impression / Assessment and Plan / UC Course  I have reviewed the triage vital signs and the nursing notes.  Pertinent labs & imaging results that were available during my care of the patient were reviewed by me and considered in my medical decision making (see chart for details).    Due to [redacted] week pregnant status, will obtain rapid COVID for further evaluation.   Rapid covid negative. COVID PCR test ordered. Patient to quarantine until testing results return. No alarming signs on exam.  Patient speaking in full sentences without respiratory distress.  Symptomatic treatment discussed.  Push fluids.  Return precautions given.  Patient expresses understanding and agrees to plan.  Final Clinical Impressions(s) / UC Diagnoses   Final diagnoses:  Cough  Nasal congestion   ED Prescriptions    None     PDMP not reviewed this encounter.   Ok Edwards, PA-C 03/19/19 1455

## 2019-03-21 LAB — NOVEL CORONAVIRUS, NAA: SARS-CoV-2, NAA: NOT DETECTED

## 2019-03-22 ENCOUNTER — Telehealth (INDEPENDENT_AMBULATORY_CARE_PROVIDER_SITE_OTHER): Payer: Self-pay | Admitting: Student

## 2019-03-22 DIAGNOSIS — Z3493 Encounter for supervision of normal pregnancy, unspecified, third trimester: Secondary | ICD-10-CM

## 2019-03-22 DIAGNOSIS — Z3A34 34 weeks gestation of pregnancy: Secondary | ICD-10-CM

## 2019-03-22 NOTE — Progress Notes (Signed)
I connected with  Allison Robles on 03/22/19 at 1612 by telephone and verified that I am speaking with the correct person using two identifiers.   I discussed the limitations, risks, security and privacy concerns of performing an evaluation and management service by telephone and the availability of in person appointments. I also discussed with the patient that there may be a patient responsible charge related to this service. The patient expressed understanding and agreed to proceed.  Pt is unable to check BP at this time. Checked BP on 03/19/19: 115/76. Agrees to check BP when she arrives home and log into Babyscripts. Denies any blurry vision, dizziness, and headache.  Marjo Bicker, RN 03/22/2019  4:12 PM

## 2019-03-22 NOTE — Progress Notes (Signed)
Patient ID: Allison Robles, female   DOB: [birth year], 24 y.o.   MRN: [id number] I connected with@ on 03/22/19 at  4:15 PM EST by: My Chart and verified that I am speaking with the correct person using two identifiers.  Patient is located at work  and provider is located at [place].     The purpose of this virtual visit is to provide medical care while limiting exposure to the novel coronavirus. I discussed the limitations, risks, security and privacy concerns of performing an evaluation and management service by My chart and the availability of in person appointments. I also discussed with the patient that there may be a patient responsible charge related to this service. By engaging in this virtual visit, you consent to the provision of healthcare.  Additionally, you authorize for your insurance to be billed for the services provided during this visit.  The patient expressed understanding and agreed to proceed.  The following staff members participated in the virtual visit:  Maxwell Marion    PRENATAL VISIT NOTE  Subjective:  Allison Robles is a 24 y.o. G1P0000 at [redacted]w[redacted]d  for phone visit for ongoing prenatal care.  She is currently monitored for the following issues for this low-risk pregnancy and has Supervision of low-risk pregnancy; Elevated liver enzymes; Fetal abnormality during pregnancy, antepartum; and Alpha thalassemia silent carrier on their problem list.  Patient reports some upper stomach pain after she eats. .  Contractions: Irritability. Vag. Bleeding: None.  Movement: Present. Denies leaking of fluid.   The following portions of the patient's history were reviewed and updated as appropriate: allergies, current medications, past family history, past medical history, past social history, past surgical history and problem list.   Objective:  There were no vitals filed for this visit. Self-Obtained  Fetal Status:     Movement: Present     Assessment and Plan:  Pregnancy: G1P0000  at [redacted]w[redacted]d 1. Encounter for supervision of low-risk pregnancy in third trimester -Patient has [place] on Feb 8, keep that appt.  -Anticipatory guidance on the 36 week visit (cultures, etc).    Preterm labor symptoms and general obstetric precautions including but not limited to vaginal bleeding, contractions, leaking of fluid and fetal movement were reviewed in detail with the patient.  Return LROB in person on the 2- 8 if possible (has [place] that day), otherwise she can come after 2 on 2/10.  Future Appointments  Date Time Provider Department Center  04/04/2019  7:40 AM WH-MFC NURSE WH-MFC MFC-US  04/04/2019  7:45 AM WH-MFC [place] 2 WH-MFCUS MFC-US  04/05/2019  3:15 PM Calvert Cantor, CNM WOC-WOCA WOC  04/12/2019  4:15 PM Sharyon Cable, CNM WOC-WOCA WOC  04/19/2019 11:15 AM Marylene Land, CNM WOC-WOCA WOC  04/26/2019  1:15 PM Gerrit Heck, CNM WOC-WOCA WOC     Time spent on virtual visit: 12 minutes  Marylene Land, CNM

## 2019-04-04 ENCOUNTER — Other Ambulatory Visit: Payer: Self-pay

## 2019-04-04 ENCOUNTER — Ambulatory Visit (HOSPITAL_COMMUNITY): Payer: Medicaid Other | Admitting: *Deleted

## 2019-04-04 ENCOUNTER — Encounter (HOSPITAL_COMMUNITY): Payer: Self-pay

## 2019-04-04 ENCOUNTER — Ambulatory Visit (HOSPITAL_COMMUNITY)
Admission: RE | Admit: 2019-04-04 | Discharge: 2019-04-04 | Disposition: A | Discharge status: Home / Self Care | Payer: Medicaid Other | Source: Ambulatory Visit | Attending: Obstetrics and Gynecology | Admitting: Obstetrics and Gynecology

## 2019-04-04 DIAGNOSIS — D563 Thalassemia minor: Secondary | ICD-10-CM | POA: Diagnosis present

## 2019-04-04 DIAGNOSIS — O358XX0 Maternal care for other (suspected) fetal abnormality and damage, not applicable or unspecified: Secondary | ICD-10-CM | POA: Diagnosis not present

## 2019-04-04 DIAGNOSIS — Z3A36 36 weeks gestation of pregnancy: Secondary | ICD-10-CM

## 2019-04-04 DIAGNOSIS — O35EXX0 Maternal care for other (suspected) fetal abnormality and damage, fetal genitourinary anomalies, not applicable or unspecified: Secondary | ICD-10-CM

## 2019-04-04 DIAGNOSIS — R748 Abnormal levels of other serum enzymes: Secondary | ICD-10-CM | POA: Diagnosis present

## 2019-04-04 DIAGNOSIS — Z362 Encounter for other antenatal screening follow-up: Secondary | ICD-10-CM

## 2019-04-05 ENCOUNTER — Other Ambulatory Visit (HOSPITAL_COMMUNITY)
Admission: RE | Admit: 2019-04-05 | Discharge: 2019-04-05 | Disposition: A | Discharge status: Home / Self Care | Payer: Medicaid Other | Source: Ambulatory Visit | Attending: Advanced Practice Midwife | Admitting: Advanced Practice Midwife

## 2019-04-05 ENCOUNTER — Ambulatory Visit (INDEPENDENT_AMBULATORY_CARE_PROVIDER_SITE_OTHER): Payer: Self-pay | Admitting: Advanced Practice Midwife

## 2019-04-05 ENCOUNTER — Encounter: Payer: Medicaid Other | Admitting: Advanced Practice Midwife

## 2019-04-05 VITALS — BP 114/73 | HR 89 | Wt 151.8 lb

## 2019-04-05 DIAGNOSIS — Z3A36 36 weeks gestation of pregnancy: Secondary | ICD-10-CM

## 2019-04-05 DIAGNOSIS — Z3493 Encounter for supervision of normal pregnancy, unspecified, third trimester: Secondary | ICD-10-CM | POA: Insufficient documentation

## 2019-04-05 DIAGNOSIS — O359XX0 Maternal care for (suspected) fetal abnormality and damage, unspecified, not applicable or unspecified: Secondary | ICD-10-CM

## 2019-04-05 DIAGNOSIS — Z3009 Encounter for other general counseling and advice on contraception: Secondary | ICD-10-CM

## 2019-04-05 NOTE — Patient Instructions (Addendum)
Group B Streptococcus Test During Pregnancy Why am I having this test? Routine testing, also called screening, for group B streptococcus (GBS) is recommended for all pregnant women between the 36th and 37th week of pregnancy. GBS is a type of bacteria that can be passed from mother to baby during childbirth. Screening will help guide whether or not you will need treatment during labor and delivery to prevent complications such as:  An infection in your uterus during labor.  An infection in your uterus after delivery.  A serious infection in your baby after delivery, such as pneumonia, meningitis, or sepsis. GBS screening is not often done before 36 weeks of pregnancy unless you go into labor prematurely. What happens if I have group B streptococcus? If testing shows that you have GBS, your health care provider will recommend treatment with IV antibiotics during labor and delivery. This treatment significantly decreases the risk of complications for you and your baby. If you have a planned C-section and you have GBS, you may not need to be treated with antibiotics because GBS is usually passed to babies after labor starts and your water breaks. If you are in labor or your water breaks before your C-section, it is possible for GBS to get into your uterus and be passed to your baby, so you might need treatment. Is there a chance I may not need to be tested? You may not need to be tested for GBS if:  You have a urine test that shows GBS before 36 to 37 weeks.  You had a baby with GBS infection after a previous delivery. In these cases, you will automatically be treated for GBS during labor and delivery. What is being tested? This test is done to check if you have group B streptococcus in your vagina or rectum. What kind of sample is taken? To collect samples for this test, your health care provider will swab your vagina and rectum with a cotton swab. The sample is then sent to the lab to see if  GBS is present. What happens during the test?   You will remove your clothing from the waist down.  You will lie down on an exam table in the same position as you would for a pelvic exam.  Your health care provider will swab your vagina and rectum to collect samples for a culture test.  You will be able to go home after the test and do all your usual activities. How are the results reported? The test results are reported as positive or negative. What do the results mean?  A positive test means you are at risk for passing GBS to your baby during labor and delivery. Your health care provider will recommend that you are treated with an IV antibiotic during labor and delivery.  A negative test means you are at very low risk of passing GBS to your baby. There is still a low risk of passing GBS to your baby because sometimes test results may report that you do not have a condition when you do (false-negative result) or there is a chance that you may become infected with GBS after the test is done. You most likely will not need to be treated with an antibiotic during labor and delivery. Talk with your health care provider about what your results mean. Questions to ask your health care provider Ask your health care provider, or the department that is doing the test:  When will my results be ready?  How will I   get my results?  What are my treatment options? Summary  Routine testing (screening) for group B streptococcus (GBS) is recommended for all pregnant women between the 36th and 37th week of pregnancy.  GBS is a type of bacteria that can be passed from mother to baby during childbirth.  If testing shows that you have GBS, your health care provider will recommend that you are treated with IV antibiotics during labor and delivery. This treatment almost always prevents infection in newborns. This information is not intended to replace advice given to you by your health care provider. Make  sure you discuss any questions you have with your health care provider. Document Revised: 06/03/2018 Document Reviewed: 03/10/2018 Elsevier Patient Education  [place].    Intrauterine Device Information An intrauterine device (IUD) is a medical device that is inserted in the uterus to prevent pregnancy. It is a small, T-shaped device that has one or two nylon strings hanging down from it. The strings hang out of the lower part of the uterus (cervix) to allow for future IUD removal. There are two types of IUDs available:  Hormone IUD. This type of IUD is made of plastic and contains the hormone progestin (synthetic progesterone). A hormone IUD may last 3-5 years.  Copper IUD. This type of IUD has copper wire wrapped around it. A copper IUD may last up to 10 years. How is an IUD inserted? An IUD is inserted through the vagina and placed into the uterus with a minor medical procedure. The exact procedure for IUD insertion may vary among health care providers and hospitals. How does an IUD work? Synthetic progesterone in a hormonal IUD prevents pregnancy by:  Thickening cervical mucus to prevent sperm from entering the uterus.  Thinning the uterine lining to prevent a fertilized egg from being implanted there. Copper in a copper IUD prevents pregnancy by making the uterus and fallopian tubes produce a fluid that kills sperm. What are the advantages of an IUD? Advantages of either type of IUD  It is highly effective in preventing pregnancy.  It is reversible. You can become pregnant shortly after the IUD is removed.  It is low-maintenance and can stay in place for a long time.  There are no estrogen-related side effects.  It can be used when breastfeeding.  It is not associated with weight gain.  It can be inserted right after childbirth, an abortion, or a miscarriage. Advantages of a hormone IUD  If it is inserted within 7 days of your period starting, it works right  after it is inserted. If the hormone IUD is inserted at any other time in your cycle, you will need to use a backup method of birth control for 7 days after insertion.  It can make menstrual periods lighter.  It can reduce menstrual cramping.  It can be used for 3-5 years. Advantages of a copper IUD  It works right after it is inserted.  It can be used as a form of emergency birth control if it is inserted within 5 days after having unprotected sex.  It does not interfere with your body's natural hormones.  It can be used for 10 years. What are the disadvantages of an IUD?  An IUD may cause irregular menstrual bleeding for a period of time after insertion.  You may have pain during insertion and have cramping and vaginal bleeding after insertion.  An IUD may cut the uterus (uterine perforation) when it is inserted. This is rare.  An  IUD may cause pelvic inflammatory disease (PID), which is an infection in the uterus and fallopian tubes. This is rare, and it usually happens during the first 20 days after the IUD is inserted.  A copper IUD can make your menstrual flow heavier and more painful. How is an IUD removed?  You will lie on your back with your knees bent and your feet in footrests (stirrups).  A device will be inserted into your vagina to spread apart the vaginal walls (speculum). This will allow your health care provider to see the strings attached to the IUD.  Your health care provider will use a small instrument (forceps) to grasp the IUD strings and pull firmly until the IUD is removed. You may have some discomfort when the IUD is removed. Your health care provider may recommend taking over-the-counter pain relievers, such as ibuprofen, before the procedure. You may also have minor spotting for a few days after the procedure. The exact procedure for IUD removal may vary among health care providers and hospitals. Is the IUD right for me? Your health care provider will  make sure you are a good candidate for an IUD and will discuss the advantages, disadvantages, and possible side effects with you. Summary  An intrauterine device (IUD) is a medical device that is inserted in the uterus to prevent pregnancy. It is a small, T-shaped device that has one or two nylon strings hanging down from it.  A hormone IUD contains the hormone progestin (synthetic progesterone). A copper IUD has copper wire wrapped around it.  Synthetic progesterone in a hormone IUD prevents pregnancy by thickening cervical mucus and thinning the walls of the uterus. Copper in a copper IUD prevents pregnancy by making the uterus and fallopian tubes produce a fluid that kills sperm.  A hormone IUD can be left in place for 3-5 years. A copper IUD can be left in place for up to 10 years.  An IUD is inserted and removed by a health care provider. You may feel some pain during insertion and removal. Your health care provider may recommend taking over-the-counter pain medicine, such as ibuprofen, before an IUD procedure. This information is not intended to replace advice given to you by your health care provider. Make sure you discuss any questions you have with your health care provider. Document Revised: 01/23/2017 Document Reviewed: 03/11/2016     Elsevier Patient Education  2020 [employer].

## 2019-04-05 NOTE — Progress Notes (Signed)
   PRENATAL VISIT NOTE  Subjective:  Allison Robles is a 24 y.o. G1P0000 at [redacted]w[redacted]d being seen today for ongoing prenatal care.  She is currently monitored for the following issues for this low-risk pregnancy and has Supervision of low-risk pregnancy; Elevated liver enzymes; Fetal abnormality during pregnancy, antepartum; and Alpha thalassemia silent carrier on their problem list.  Patient reports no complaints.  Contractions: Irritability. Vag. Bleeding: None.  Movement: Present. Denies leaking of fluid.   The following portions of the patient's history were reviewed and updated as appropriate: allergies, current medications, past family history, past medical history, past social history, past surgical history and problem list. Problem list updated.  Objective:   Vitals:   04/05/19 1009  BP: 114/73  Pulse: 89  Weight: 151 lb 12.8 oz (68.9 kg)    Fetal Status: Fetal Heart Rate (bpm): 137 Fundal Height: 36 cm Movement: Present  Presentation: Vertex  General:  Alert, oriented and cooperative. Patient is in no acute distress.  Skin: Skin is warm and dry. No rash noted.   Cardiovascular: Normal heart rate noted  Respiratory: Normal respiratory effort, no problems with respiration noted  Abdomen: Soft, gravid, appropriate for gestational age.  Pain/Pressure: Present     Pelvic: Cervical exam performed Dilation: Closed Effacement (%): Thick Station: Ballotable  Extremities: Normal range of motion.  Edema: None  Mental Status: Normal mood and affect. Normal behavior. Normal judgment and thought content.   Assessment and Plan:  Pregnancy: G1P0000 at [redacted]w[redacted]d  1. Encounter for supervision of low-risk pregnancy in third trimester - Routine LOB - Discussed plans for circumcision. Patient unaware of out of pocket cost. Reviewed pricing for inpatient vs. Outpatient. Now considering outpatient. - Confirmed location of MAU for labor check - GC/Chlamydia probe amp ([place])not at [facility] -  Culture, beta strep (group b only)  2. Encounter for counseling regarding contraception - Previously considering Depo or OCPs. Discussed inpatient options including Nexplanon and postplacental IUD. R/b discussed. Continue next visit  3. Fetal abnormality during pregnancy, antepartum, single or unspecified fetus - Mild bilateral pyelectasis per MFM, most recent imaging 04/04/2019  Preterm labor symptoms and general obstetric precautions including but not limited to vaginal bleeding, contractions, leaking of fluid and fetal movement were reviewed in detail with the patient. Please refer to After Visit Summary for other counseling recommendations.  Return in about 1 week (around 04/12/2019) for Virtual.  Future Appointments  Date Time Provider Department Center  04/12/2019  4:15 PM Mcneil Sober [facility]  04/19/2019 11:15 AM Crisoforo Oxford, Charlesetta Garibaldi, CNM [facility]  04/26/2019  1:15 PM Gerrit Heck, CNM [facility]    Calvert Cantor, CNM

## 2019-04-06 LAB — GC/CHLAMYDIA PROBE AMP (~~LOC~~) NOT AT ARMC
Chlamydia: NEGATIVE
Comment: NEGATIVE
Comment: NORMAL
Neisseria Gonorrhea: NEGATIVE

## 2019-04-09 LAB — CULTURE, BETA STREP (GROUP B ONLY): Strep Gp B Culture: NEGATIVE

## 2019-04-12 ENCOUNTER — Encounter: Payer: Self-pay | Admitting: Certified Nurse Midwife

## 2019-04-12 ENCOUNTER — Telehealth (INDEPENDENT_AMBULATORY_CARE_PROVIDER_SITE_OTHER): Payer: Self-pay | Admitting: Certified Nurse Midwife

## 2019-04-12 DIAGNOSIS — O359XX0 Maternal care for (suspected) fetal abnormality and damage, unspecified, not applicable or unspecified: Secondary | ICD-10-CM

## 2019-04-12 DIAGNOSIS — Z3A37 37 weeks gestation of pregnancy: Secondary | ICD-10-CM

## 2019-04-12 DIAGNOSIS — Z3493 Encounter for supervision of normal pregnancy, unspecified, third trimester: Secondary | ICD-10-CM

## 2019-04-12 DIAGNOSIS — D563 Thalassemia minor: Secondary | ICD-10-CM

## 2019-04-12 NOTE — Progress Notes (Signed)
   TELEHEALTH VIRTUAL OBSTETRICS VISIT ENCOUNTER NOTE  I connected with Allison Robles on 04/12/19 at  4:15 PM EST by telephone at home and verified that I am speaking with the correct person using two identifiers.   I discussed the limitations, risks, security and privacy concerns of performing an evaluation and management service by telephone and the availability of in person appointments. I also discussed with the patient that there may be a patient responsible charge related to this service. The patient expressed understanding and agreed to proceed.  Subjective:  Allison Robles is a 24 y.o. G1P0000 at [redacted]w[redacted]d being followed for ongoing prenatal care.  She is currently monitored for the following issues for this low-risk pregnancy and has Supervision of low-risk pregnancy; Elevated liver enzymes; Fetal abnormality during pregnancy, antepartum; and Alpha thalassemia silent carrier on their problem list.  Patient reports occasional contractions. Reports fetal movement. Denies any bleeding or leaking of fluid.   Patient request telephone visit d/t being in location without good cell service on phone.   The following portions of the patient's history were reviewed and updated as appropriate: allergies, current medications, past family history, past medical history, past social history, past surgical history and problem list.   Objective:   General:  Alert, oriented and cooperative.   Mental Status: Normal mood and affect perceived. Normal judgment and thought content.  Rest of physical exam deferred due to type of encounter  Assessment and Plan:  Pregnancy: G1P0000 at [redacted]w[redacted]d 1. Alpha thalassemia silent carrier  2. Fetal abnormality during pregnancy, antepartum, single or unspecified fetus - Follow up as scheduled, no more [place] needed at this time  - EFW 2871gm 6lb 5oz 46% at [redacted]w[redacted]d  - Encouraged to let pediatrician known about UTD after deliver so that necessary additional imaging/US can  be performed if pediatrician needs to.   3. Encounter for supervision of low-risk pregnancy in third trimester - Patient doing well, reports occasional contractions  - Reports occasional contractions that occur once an hour, Educated and discussed BH contractions vs true labor contractions and when to go to MAU for evaluation  - Routine prenatal care - Anticipatory guidance on upcoming appointments    Term labor symptoms and general obstetric precautions including but not limited to vaginal bleeding, contractions, leaking of fluid and fetal movement were reviewed in detail with the patient.  I discussed the assessment and treatment plan with the patient. The patient was provided an opportunity to ask questions and all were answered. The patient agreed with the plan and demonstrated an understanding of the instructions. The patient was advised to call back or seek an in-person office evaluation/go to MAU at [facility] for any urgent or concerning symptoms. Please refer to After Visit Summary for other counseling recommendations.   I provided 10 minutes of non-face-to-face time during this encounter.  Return in about 1 week (around 04/19/2019) for ROB-mychart.  Future Appointments  Date Time Provider Department Center  04/12/2019  4:15 PM Mcneil Sober [facility]  04/19/2019 11:15 AM Crisoforo Oxford, Charlesetta Garibaldi, CNM [facility]  04/26/2019  1:15 PM Gerrit Heck, CNM [facility]    Sharyon Cable, CNM Center for [employer], [facility]

## 2019-04-12 NOTE — Progress Notes (Signed)
I connected with  Devonne Doughty on 04/12/19 at  4:15 PM EST by telephone and verified that I am speaking with the correct person using two identifiers.   I discussed the limitations, risks, security and privacy concerns of performing an evaluation and management service by telephone and the availability of in person appointments. I also discussed with the patient that there may be a patient responsible charge related to this service. The patient expressed understanding and agreed to proceed.  [name] Giannina Bartolome, CMA 04/12/2019  1:22 PM

## 2019-04-14 ENCOUNTER — Encounter: Payer: Self-pay | Admitting: Student

## 2019-04-15 ENCOUNTER — Telehealth: Payer: Self-pay | Admitting: *Deleted

## 2019-04-15 NOTE — Telephone Encounter (Signed)
Allison Robles sent a MyChart message yesterday pm that she had swelling and feet mushy. Per chart is [redacted] weeks pregnant. I called [name] and she states she elevated her feet and the swelling resolved. States she has no swelling today. She also confirms she had a headache yesterday =8 but was relieved with tylenol. She confirms no headache today. I asked if she has good fetal movement and she confirms baby is moving a lot. I also asked if she can check her blood pressure. She states she can't right now, but will later. I asked her to call us before 12 today if her blood pressure is 140/ 90 or higher; repeat and then call us. I also instructed her to go to the hospital if it is 160/100 or higher or she has sudden increase in edema, severe headache , decreased fetal movement, visual disturbances, leaking of fluid, uc's 5 minutes apart, etc. She voices understanding.  Daiden Coltrane,RN

## 2019-04-19 ENCOUNTER — Inpatient Hospital Stay (HOSPITAL_COMMUNITY)
Admission: AD | Admit: 2019-04-19 | Discharge: 2019-04-19 | Disposition: A | Discharge status: Home / Self Care | Payer: Medicaid Other | Attending: Family Medicine | Admitting: Family Medicine

## 2019-04-19 ENCOUNTER — Encounter (HOSPITAL_COMMUNITY): Payer: Self-pay | Admitting: Family Medicine

## 2019-04-19 ENCOUNTER — Other Ambulatory Visit: Payer: Self-pay

## 2019-04-19 ENCOUNTER — Telehealth: Payer: Medicaid Other | Admitting: Student

## 2019-04-19 DIAGNOSIS — Z5329 Procedure and treatment not carried out because of patient's decision for other reasons: Secondary | ICD-10-CM

## 2019-04-19 DIAGNOSIS — M545 Low back pain: Secondary | ICD-10-CM | POA: Diagnosis present

## 2019-04-19 DIAGNOSIS — O99893 Other specified diseases and conditions complicating puerperium: Secondary | ICD-10-CM | POA: Insufficient documentation

## 2019-04-19 DIAGNOSIS — O479 False labor, unspecified: Secondary | ICD-10-CM

## 2019-04-19 DIAGNOSIS — R109 Unspecified abdominal pain: Secondary | ICD-10-CM | POA: Insufficient documentation

## 2019-04-19 DIAGNOSIS — O26893 Other specified pregnancy related conditions, third trimester: Secondary | ICD-10-CM | POA: Diagnosis not present

## 2019-04-19 DIAGNOSIS — F17290 Nicotine dependence, other tobacco product, uncomplicated: Secondary | ICD-10-CM | POA: Diagnosis not present

## 2019-04-19 DIAGNOSIS — O99333 Smoking (tobacco) complicating pregnancy, third trimester: Secondary | ICD-10-CM | POA: Diagnosis not present

## 2019-04-19 DIAGNOSIS — D563 Thalassemia minor: Secondary | ICD-10-CM

## 2019-04-19 DIAGNOSIS — R748 Abnormal levels of other serum enzymes: Secondary | ICD-10-CM

## 2019-04-19 DIAGNOSIS — Z3A38 38 weeks gestation of pregnancy: Secondary | ICD-10-CM | POA: Diagnosis not present

## 2019-04-19 DIAGNOSIS — Z91199 Patient's noncompliance with other medical treatment and regimen due to unspecified reason: Secondary | ICD-10-CM

## 2019-04-19 HISTORY — DX: Bacterial meningitis, unspecified: G00.9

## 2019-04-19 LAB — URINALYSIS, ROUTINE W REFLEX MICROSCOPIC
Bilirubin Urine: NEGATIVE
Glucose, UA: NEGATIVE mg/dL
Hgb urine dipstick: NEGATIVE
Ketones, ur: NEGATIVE mg/dL
Leukocytes,Ua: NEGATIVE
Nitrite: NEGATIVE
Protein, ur: NEGATIVE mg/dL
Specific Gravity, Urine: 1.012 (ref 1.005–1.030)
pH: 6 (ref 5.0–8.0)

## 2019-04-19 NOTE — MAU Provider Note (Signed)
Chief Complaint:  Back Pain   First Provider Initiated Contact with Patient 04/19/19 2005     HPI: Allison Robles is a 24 y.o. G1P0000 at 2w4dwho presents to maternity admissions reporting low back pain which comes and goes, since hiking today.  Did not drink much.  Felt short of breath due to the cramps, but this has resolved now.  Only feels one contractions per hour, though monitor shows frequent mild UCs.. She reports good fetal movement, denies LOF, vaginal bleeding, vaginal itching/burning, urinary symptoms, h/a, dizziness, n/v, diarrhea, constipation or fever/chills.  She denies headache, visual changes or RUQ abdominal pain.  Back Pain This is a new problem. The current episode started today. The problem occurs intermittently. The problem has been waxing and waning since onset. The pain is present in the lumbar spine. The quality of the pain is described as cramping. The pain does not radiate. The pain is moderate. Associated symptoms include abdominal pain (mild contractions). Pertinent negatives include no dysuria, fever, headaches, leg pain, numbness, tingling or weakness. Treatments tried: rest. The treatment provided mild relief.   RN Note: Started w/ lower back cramping that comes and goes and started this evening around 1730.  The pain felt like it traveled to her spine and then felt like it made her lungs hurt.  Stated she went hiking this afternoon and started having cramping while driving back.   Denies VB/LOF.  + FM.  Past Medical History: Past Medical History:  Diagnosis Date  . Anemia   . Bacterial meningitis    as newborn, has hearing loss in left ear  . Medical history non-contributory     Past obstetric history: OB History  Gravida Para Term Preterm AB Living  1 0 0 0 0 0  SAB TAB Ectopic Multiple Live Births  0 0 0 0 0    # Outcome Date GA Lbr Len/2nd Weight Sex Delivery Anes PTL Lv  1 Current             Past Surgical History: Past Surgical History:   Procedure Laterality Date  . NO PAST SURGERIES      Family History: History reviewed. No pertinent family history.  Social History: Social History   Tobacco Use  . Smoking status: Current Every Day Smoker    Types: Cigars  . Smokeless tobacco: Never Used  . Tobacco comment: Pt smokes 3 black and milds a day  Substance Use Topics  . Alcohol use: No  . Drug use: Yes    Types: Marijuana    Comment: last used beginning of pregnancy    Allergies: No Known Allergies  Meds:  Medications Prior to Admission  Medication Sig Dispense Refill Last Dose  . Prenatal Vit-Fe Fumarate-FA (PRENATAL VITAMIN) 27-0.8 MG TABS Take 1 tablet by mouth daily. 30 tablet 1 04/18/2019 at Unknown time  . Blood Pressure Monitoring DEVI 1 Device by Does not apply route once a week. 1 Device 0     I have reviewed patient's Past Medical Hx, Surgical Hx, Family Hx, Social Hx, medications and allergies.   ROS:  Review of Systems  Constitutional: Negative for fever.  Gastrointestinal: Positive for abdominal pain (mild contractions).  Genitourinary: Negative for dysuria.  Musculoskeletal: Positive for back pain.  Neurological: Negative for tingling, weakness, numbness and headaches.   Other systems negative  Physical Exam   Patient Vitals for the past 24 hrs:  BP Temp Pulse Resp SpO2 Weight  04/19/19 1921 135/68 98.4 F (36.9 C) 89 17 100 %  69.4 kg   Constitutional: Well-developed, well-nourished female in no acute distress.  Cardiovascular: normal rate and rhythm Respiratory: normal effort, clear to auscultation bilaterally GI: Abd soft, non-tender, gravid appropriate for gestational age.   No rebound or guarding. MS: Extremities nontender, no edema, normal ROM Neurologic: Alert and oriented x 4.  GU: Neg CVAT.  PELVIC EXAM:    Dilation: 1 Effacement (%): 20 Cervical Position: Posterior Station: -3 Presentation: Vertex Exam by:: Mayford Knife CNM  FHT:  Baseline 140 , moderate variability,  accelerations present, no decelerations Contractions: q 2 mins Irregular  Most not felt by patient   Labs: Results for orders placed or performed during the hospital encounter of 04/19/19 (from the past 24 hour(s))  Urinalysis, Routine w reflex microscopic     Status: Abnormal   Collection Time: 04/19/19  7:55 PM  Result Value Ref Range   Color, Urine YELLOW YELLOW   APPearance HAZY (A) CLEAR   Specific Gravity, Urine 1.012 1.005 - 1.030   pH 6.0 5.0 - 8.0   Glucose, UA NEGATIVE NEGATIVE mg/dL   Hgb urine dipstick NEGATIVE NEGATIVE   Bilirubin Urine NEGATIVE NEGATIVE   Ketones, ur NEGATIVE NEGATIVE mg/dL   Protein, ur NEGATIVE NEGATIVE mg/dL   Nitrite NEGATIVE NEGATIVE   Leukocytes,Ua NEGATIVE NEGATIVE   O/Positive/-- (10/26 1238)  Imaging:    MAU Course/MDM: I have ordered labs and reviewed results. UA is clear Dilation: 1 Effacement (%): 50 Cervical Position: Posterior Station: -3 Presentation: Vertex Exam by:: Wynelle Bourgeois, CNM  NST reviewed, reactive with irregular contractions Reviewed labor signs and expected progress Treatments in MAU included EFM, labor eval.    Assessment: Single IUP at [redacted]w[redacted]d Uterine contractions in pregnancy One episode of chest wall pain, now resolved  Plan: Discharge home Labor precautions and fetal kick counts Follow up in Office for prenatal visits and recheck of cervix  Encouraged to return here or to other Urgent Care/ED if she develops worsening of symptoms, increase in pain, fever, or other concerning symptoms.   Pt stable at time of discharge.  Wynelle Bourgeois CNM, MSN Certified Nurse-Midwife 04/19/2019 8:05 PM

## 2019-04-19 NOTE — MAU Note (Signed)
Started w/ lower back cramping that comes and goes and started this evening around 1730.  The pain felt like it traveled to her spine and then felt like it made her lungs hurt.  Stated she went hiking this afternoon and started having cramping while driving back.   Denies VB/LOF.  + FM.

## 2019-04-19 NOTE — Progress Notes (Signed)
1130-- Called patient for virtual appt, phone message immediately states "your call cannot be completed at this time". Mychart video invitation link sent to patient.

## 2019-04-19 NOTE — Discharge Instructions (Signed)

## 2019-04-19 NOTE — Progress Notes (Signed)
Patient ID: Allison Robles, female   DOB: [birth year], 25 y.o.   MRN: [id number] Patient did not keep visit; she will need to be called to reschedule.   Samara Deist

## 2019-04-26 ENCOUNTER — Inpatient Hospital Stay (HOSPITAL_COMMUNITY)
Admission: AD | Admit: 2019-04-26 | Discharge: 2019-04-29 | DRG: 807 | Disposition: A | Discharge status: Home / Self Care | Payer: Medicaid Other | Attending: Family Medicine | Admitting: Family Medicine

## 2019-04-26 ENCOUNTER — Other Ambulatory Visit: Payer: Self-pay

## 2019-04-26 ENCOUNTER — Encounter (HOSPITAL_COMMUNITY): Payer: Self-pay | Admitting: Family Medicine

## 2019-04-26 ENCOUNTER — Telehealth (INDEPENDENT_AMBULATORY_CARE_PROVIDER_SITE_OTHER): Payer: Self-pay

## 2019-04-26 VITALS — BP 122/74 | HR 93

## 2019-04-26 DIAGNOSIS — D563 Thalassemia minor: Secondary | ICD-10-CM | POA: Diagnosis present

## 2019-04-26 DIAGNOSIS — R748 Abnormal levels of other serum enzymes: Secondary | ICD-10-CM

## 2019-04-26 DIAGNOSIS — O9902 Anemia complicating childbirth: Secondary | ICD-10-CM | POA: Diagnosis present

## 2019-04-26 DIAGNOSIS — D649 Anemia, unspecified: Secondary | ICD-10-CM | POA: Diagnosis present

## 2019-04-26 DIAGNOSIS — O359XX1 Maternal care for (suspected) fetal abnormality and damage, unspecified, fetus 1: Secondary | ICD-10-CM

## 2019-04-26 DIAGNOSIS — Z3A39 39 weeks gestation of pregnancy: Secondary | ICD-10-CM

## 2019-04-26 DIAGNOSIS — O99334 Smoking (tobacco) complicating childbirth: Secondary | ICD-10-CM | POA: Diagnosis present

## 2019-04-26 DIAGNOSIS — F17290 Nicotine dependence, other tobacco product, uncomplicated: Secondary | ICD-10-CM | POA: Diagnosis present

## 2019-04-26 DIAGNOSIS — Z3493 Encounter for supervision of normal pregnancy, unspecified, third trimester: Secondary | ICD-10-CM

## 2019-04-26 DIAGNOSIS — O358XX0 Maternal care for other (suspected) fetal abnormality and damage, not applicable or unspecified: Secondary | ICD-10-CM | POA: Diagnosis present

## 2019-04-26 DIAGNOSIS — Z20822 Contact with and (suspected) exposure to covid-19: Secondary | ICD-10-CM | POA: Diagnosis present

## 2019-04-26 LAB — CBC
HCT: 38.2 % (ref 36.0–46.0)
Hemoglobin: 11.5 g/dL — ABNORMAL LOW (ref 12.0–15.0)
MCH: 23.7 pg — ABNORMAL LOW (ref 26.0–34.0)
MCHC: 30.1 g/dL (ref 30.0–36.0)
MCV: 78.8 fL — ABNORMAL LOW (ref 80.0–100.0)
Platelets: 295 10*3/uL (ref 150–400)
RBC: 4.85 MIL/uL (ref 3.87–5.11)
RDW: 15.9 % — ABNORMAL HIGH (ref 11.5–15.5)
WBC: 15.9 10*3/uL — ABNORMAL HIGH (ref 4.0–10.5)
nRBC: 0 % (ref 0.0–0.2)

## 2019-04-26 LAB — TYPE AND SCREEN
ABO/RH(D): O POS
Antibody Screen: NEGATIVE

## 2019-04-26 LAB — ABO/RH: ABO/RH(D): O POS

## 2019-04-26 MED ORDER — LACTATED RINGERS IV SOLN
500.0000 mL | INTRAVENOUS | Status: DC | PRN
  Administered 2019-04-27: 04:00:00 500 mL via INTRAVENOUS

## 2019-04-26 MED ORDER — FENTANYL-BUPIVACAINE-NACL 0.5-0.125-0.9 MG/250ML-% EP SOLN
12.0000 mL/h | EPIDURAL | Status: DC | PRN
  Filled 2019-04-26: qty 250

## 2019-04-26 MED ORDER — TERBUTALINE SULFATE 1 MG/ML IJ SOLN
0.2500 mg | Freq: Once | INTRAMUSCULAR | Status: DC | PRN
  Filled 2019-04-26: qty 1

## 2019-04-26 MED ORDER — EPHEDRINE 5 MG/ML INJ: 10.0000 mg | INTRAVENOUS | Status: DC | PRN

## 2019-04-26 MED ORDER — LACTATED RINGERS IV SOLN
500.0000 mL | Freq: Once | INTRAVENOUS | Status: AC
  Administered 2019-04-27: 03:00:00 500 mL via INTRAVENOUS

## 2019-04-26 MED ORDER — DIPHENHYDRAMINE HCL 50 MG/ML IJ SOLN: 12.5000 mg | INTRAMUSCULAR | Status: DC | PRN

## 2019-04-26 MED ORDER — PHENYLEPHRINE 40 MCG/ML (10ML) SYRINGE FOR IV PUSH (FOR BLOOD PRESSURE SUPPORT)
80.0000 ug | PREFILLED_SYRINGE | INTRAVENOUS | Status: DC | PRN
  Filled 2019-04-26: qty 10

## 2019-04-26 MED ORDER — TERBUTALINE SULFATE 1 MG/ML IJ SOLN
INTRAMUSCULAR | Status: AC
  Filled 2019-04-26: qty 1

## 2019-04-26 MED ORDER — ONDANSETRON HCL 4 MG/2ML IJ SOLN
4.0000 mg | Freq: Four times a day (QID) | INTRAMUSCULAR | Status: DC | PRN
  Administered 2019-04-27: 4 mg via INTRAVENOUS
  Filled 2019-04-26: qty 2

## 2019-04-26 MED ORDER — TERBUTALINE SULFATE 1 MG/ML IJ SOLN
0.2500 mg | Freq: Once | INTRAMUSCULAR | Status: AC
  Administered 2019-04-26: 0.25 mg via SUBCUTANEOUS

## 2019-04-26 MED ORDER — PHENYLEPHRINE 40 MCG/ML (10ML) SYRINGE FOR IV PUSH (FOR BLOOD PRESSURE SUPPORT): 80.0000 ug | PREFILLED_SYRINGE | INTRAVENOUS | Status: DC | PRN

## 2019-04-26 MED ORDER — OXYTOCIN BOLUS FROM INFUSION
500.0000 mL | Freq: Once | INTRAVENOUS | Status: AC
  Administered 2019-04-27: 500 mL via INTRAVENOUS

## 2019-04-26 MED ORDER — OXYTOCIN 40 UNITS IN NORMAL SALINE INFUSION - SIMPLE MED
1.0000 m[IU]/min | INTRAVENOUS | Status: DC
  Administered 2019-04-26: 2 m[IU]/min via INTRAVENOUS

## 2019-04-26 MED ORDER — OXYTOCIN 40 UNITS IN NORMAL SALINE INFUSION - SIMPLE MED
2.5000 [IU]/h | INTRAVENOUS | Status: DC
  Filled 2019-04-26: qty 1000

## 2019-04-26 MED ORDER — SOD CITRATE-CITRIC ACID 500-334 MG/5ML PO SOLN: 30.0000 mL | ORAL | Status: DC | PRN

## 2019-04-26 MED ORDER — TERBUTALINE SULFATE 1 MG/ML IJ SOLN
0.2500 mg | Freq: Once | INTRAMUSCULAR | Status: AC | PRN
  Administered 2019-04-27: 05:00:00 0.25 mg via SUBCUTANEOUS

## 2019-04-26 MED ORDER — ACETAMINOPHEN 325 MG PO TABS: 650.0000 mg | ORAL_TABLET | ORAL | Status: DC | PRN

## 2019-04-26 MED ORDER — LIDOCAINE HCL (PF) 1 % IJ SOLN
30.0000 mL | INTRAMUSCULAR | Status: AC | PRN
  Administered 2019-04-27: 3 mL via SUBCUTANEOUS
  Administered 2019-04-27: 03:00:00 8 mL via SUBCUTANEOUS

## 2019-04-26 MED ORDER — LACTATED RINGERS IV SOLN: INTRAVENOUS | Status: DC

## 2019-04-26 NOTE — MAU Note (Signed)
PT SAYS SHE HAD A GUSH OF FLUID IN FEB.  [facility] WITH CLINIC.  VE -  1 CM.   DENIES HSV AND MRSA.  GBS- UNSURE  UC HURT BAD SINCE 7PM

## 2019-04-26 NOTE — Patient Instructions (Signed)

## 2019-04-26 NOTE — Addendum Note (Signed)
Addended by: Gerrit Heck L on: 04/26/2019 07:46 PM   Modules accepted: Orders, SmartSet

## 2019-04-26 NOTE — Progress Notes (Signed)
   Induction Assessment Scheduling Form: Fax to [facility]:  [phone]  Allison Robles                                                                                   DOB:  06-19-1995                                                            MRN:  [id number]                                                                     Phone #:   [phone]                         Provider:  Ninfa Meeker  GP:  [id number]                                                            Estimated Date of Delivery: 04/29/19  Dating Criteria: 12 wk [place]    Medical Indications for induction:  PostDate Admission Date/Time:  3/12 at [facility] Gestational age on admission:  41weeks   Wt: 153lbs HIV:  Non Reactive (12/15 0912) GBS: Negative/-- (02/09 1033)  Bishop score TBD   Method of induction(proposed):  Foley Bulb to be placed on 3/11   Scheduling Provider Signature:  Cherre Robins, CNM                                            Today's Date:  04/26/2019

## 2019-04-26 NOTE — Progress Notes (Signed)
I connected with  Allison Robles on 04/26/19 at  1:15 PM EST by telephone and verified that I am speaking with the correct person using two identifiers.   I discussed the limitations, risks, security and privacy concerns of performing an evaluation and management service by telephone and the availability of in person appointments. I also discussed with the patient that there may be a patient responsible charge related to this service. The patient expressed understanding and agreed to proceed.  [name] Thermon Zulauf, CMA 04/26/2019  1:05 PM

## 2019-04-26 NOTE — H&P (Signed)
Klani Robles is a 24 y.o. female presenting for uterine contractions.  While being evaluated for labor in MAU, within a few minutes, RN  Noted fetal heart rate decelerations to the 80s.  She was turned side to side without resolution.  She was moved to Hands and knees with resolution to low 100s. I arrived and notified Dr Allison Robles who arrived and decided to admit her.   . OB History    Gravida  1   Para  0   Term  0   Preterm  0   AB  0   Living  0     SAB  0   TAB  0   Ectopic  0   Multiple  0   Live Births  0          Past Medical History:  Diagnosis Date  . Anemia   . Bacterial meningitis    as newborn, has hearing loss in left ear  . Medical history non-contributory    Past Surgical History:  Procedure Laterality Date  . NO PAST SURGERIES     Family History: family history is not on file. Social History:  reports that she has been smoking cigars. She has never used smokeless tobacco. She reports current drug use. Drug: Marijuana. She reports that she does not drink alcohol.     Maternal Diabetes: No Genetic Screening: Abnormal:  Results: Other: Alpha thalassemia carrier Maternal Ultrasounds/Referrals: Normal Fetal Ultrasounds or other Referrals:  None Maternal Substance Abuse:  No Significant Maternal Medications:  None Significant Maternal Lab Results:  Group B Strep negative Other Comments:  None  Review of Systems  Constitutional: Negative for chills and fever.  Eyes: Negative for visual disturbance.  Respiratory: Negative for shortness of breath.   Cardiovascular: Negative for leg swelling.  Gastrointestinal: Positive for abdominal pain. Negative for constipation, diarrhea and nausea.  Genitourinary: Negative for vaginal bleeding.  Musculoskeletal: Negative for back pain.  Neurological: Negative for dizziness.   Maternal Medical History:  Reason for admission: Contractions.  Nausea.  Contractions: Onset was 1-2 hours ago.   Frequency:  irregular.   Perceived severity is moderate.    Fetal activity: Perceived fetal activity is normal.   Last perceived fetal movement was within the past hour.    Prenatal complications: No bleeding, PIH, pre-eclampsia or preterm labor.   Prenatal Complications - Diabetes: none.    Dilation: 2.5 Effacement (%): 70 Station: -2 Exam by:: BENJI STANLEY RN Blood pressure 125/73, pulse 92, temperature 98.5 F (36.9 C), temperature source Oral, resp. rate 20, height 5\' 6"  (1.676 m), weight 70.7 kg. Maternal Exam:  Uterine Assessment: Contraction strength is moderate.  Contraction frequency is irregular.   Abdomen: Patient reports no abdominal tenderness. Estimated fetal weight is 6.5.   Fetal presentation: vertex  Introitus: Normal vulva. Normal vagina.  Ferning test: not done.  Nitrazine test: not done. Amniotic fluid character: not assessed.  Pelvis: adequate for delivery.   Cervix: Cervix evaluated by digital exam.     Fetal Exam Fetal Monitor Review: Mode: ultrasound.   Baseline rate: 140.  Variability: moderate (6-25 bpm).   Pattern: accelerations present and variable decelerations.    Fetal State Assessment: Category I - tracings are normal.     Physical Exam  Constitutional: She is oriented to person, place, and time. She appears well-developed and well-nourished. No distress.  HENT:  Head: Normocephalic.  Cardiovascular: Normal rate.  Respiratory: Effort normal. No respiratory distress.  GI: Soft. She exhibits no distension.  There is no abdominal tenderness. There is no rebound and no guarding.  Genitourinary:    Vulva normal.     Genitourinary Comments: Dilation: 2.5 Effacement (%): 70 Cervical Position: Posterior Station: -2 Presentation: Vertex Exam by:: C Cornetto RN    Musculoskeletal:        General: Normal range of motion.  Neurological: She is alert and oriented to person, place, and time.  Skin: Skin is warm and dry.  Psychiatric: She has a  normal mood and affect.    Prenatal labs: ABO, Rh: O/Positive/-- (10/26 1238) Antibody: Negative (10/26 1238) Rubella: 9.86 (10/26 1238) RPR: Non Reactive (12/15 0912)  HBsAg: Negative (10/26 1238)  HIV: Non Reactive (12/15 0912)  GBS: Negative/-- (02/09 1033)   Assessment/Plan: Single intrauterine pregnancy at [redacted]w[redacted]d Latent phase labor Fetal heart rate decelerations, resolved after 1 hour rest following Terbutaline tocolysis  Admit to Labor and Delivery Routine orders Start Pitocin augmentation after one hour rest   Allison Robles 04/26/2019, 9:58 PM

## 2019-04-26 NOTE — Progress Notes (Signed)
TELEHEALTH OBSTETRICS PRENATAL VIRTUAL VIDEO VISIT ENCOUNTER NOTE  Provider location: [facility] [place]   I connected with Allison Robles on 04/26/19 at  1:15 PM EST by MyChart Video Encounter at home and verified that I am speaking with the correct person using two identifiers.   I discussed the limitations, risks, security and privacy concerns of performing an evaluation and management service virtually and the availability of in person appointments. I also discussed with the patient that there may be a patient responsible charge related to this service. The patient expressed understanding and agreed to proceed. Subjective:  Allison Robles is a 24 y.o. G1P0000 at [redacted]w[redacted]d being seen today for ongoing prenatal care.  She is currently monitored for the following issues for this low-risk pregnancy and has Supervision of low-risk pregnancy; Elevated liver enzymes; Fetal abnormality during pregnancy, antepartum; and Alpha thalassemia silent carrier on their problem list.  Patient reports no complaints.  Contractions: Irritability. Vag. Bleeding: None.  Movement: Present. Denies any leaking of fluid.   She reports some mild contractions that she describes as cramping in her back and lower abdomen.   The following portions of the patient's history were reviewed and updated as appropriate: allergies, current medications, past family history, past medical history, past social history, past surgical history and problem list.   Objective:   Vitals:   04/26/19 1309  BP: 122/74  Pulse: 93    Fetal Status:     Movement: Present     General:  Alert, oriented and cooperative. Patient is in no acute distress.  Respiratory: Normal respiratory effort, no problems with respiration noted  Mental Status: Normal mood and affect. Normal behavior. Normal judgment and thought content.  Rest of physical exam deferred due to type of encounter  Imaging: [place] MFM OB FOLLOW UP  Result  Date: 04/04/2019 ----------------------------------------------------------------------  OBSTETRICS REPORT                       (Signed Final 04/04/2019 10:02 am) ---------------------------------------------------------------------- Patient Info  ID #:       [id number]                          D.O.B.:  October 31, 1995 (23 yrs)  Name:       Allison Robles               Visit Date: 04/04/2019 08:21 am ---------------------------------------------------------------------- Performed By  Performed By:     Marcellina Millin          Referred By:      Marylen Ponto                    RDMS  Attending:        Ma Rings MD         [facility] ---------------------------------------------------------------------- Orders   #  Description                          Code  Ordered By   1  [place] MFM OB FOLLOW UP                  [id number]     Noralee Space  ----------------------------------------------------------------------   #  Order #                    Accession #                 Episode #   1  [id number]                  [id number]                  [id number]  ---------------------------------------------------------------------- Indications   Fetal abnormality - other known or             O35.9XX0   suspected (bilateral UTD)   [redacted] weeks gestation of pregnancy                Z3A.36  ---------------------------------------------------------------------- Vital Signs                                                 Height:        5'6" ---------------------------------------------------------------------- Fetal Evaluation  Num Of Fetuses:         1  Fetal Heart Rate(bpm):  132  Cardiac Activity:       Observed  Presentation:           Cephalic  Placenta:               Anterior  P. Cord Insertion:      Previously Visualized  Amniotic Fluid  AFI FV:      Within normal limits  AFI Sum(cm)     %Tile       Largest Pocket(cm)  16.87           63           7.52  RUQ(cm)       RLQ(cm)       LUQ(cm)        LLQ(cm)  7.52          3.03          3.78           2.54 ---------------------------------------------------------------------- Biometry  BPD:      89.3  mm     G. Age:  36w 1d         53  %    CI:        76.95   %    70 - 86                                                          FL/HC:      22.9   %    20.1 - 22.1  HC:      322.4  mm     G. Age:  36w 3d         21  %    HC/AC:      1.03        0.93 - 1.11  AC:  314   mm     G. Age:  35w 2d         30  %    FL/BPD:     82.6   %    71 - 87  FL:       73.8  mm     G. Age:  37w 5d         79  %    FL/AC:      23.5   %    20 - 24  Est. FW:    2871  gm      6 lb 5 oz     46  % ---------------------------------------------------------------------- OB History  Gravidity:    2  TOP:          1 ---------------------------------------------------------------------- Gestational Age  U/S Today:     36w 3d                                        EDD:   04/29/19  Best:          36w 3d     Det. By:  Previous Ultrasound      EDD:   04/29/19                                      (10/19/18) ---------------------------------------------------------------------- Anatomy  Cranium:               Appears normal         Aortic Arch:            Previously seen  Cavum:                 Previously seen        Ductal Arch:            Previously seen  Ventricles:            Appears normal         Diaphragm:              Appears normal  Choroid Plexus:        Previously seen        Stomach:                Appears normal, left                                                                        sided  Cerebellum:            Previously seen        Abdomen:                Appears normal  Posterior Fossa:       Previously seen        Abdominal Wall:         Previously seen  Nuchal Fold:           Not applicable (>20    Cord Vessels:           Previously seen  wks GA)  Face:                  Orbits and profile      Kidneys:                Bilateral UTD                         previously seen  Lips:                  Previously seen        Bladder:                Appears normal  Thoracic:              Appears normal         Spine:                  Appears normal  Heart:                 Previously seen        Upper Extremities:      Previously seen  RVOT:                  Previously seen        Lower Extremities:      Previously seen  LVOT:                  Previously seen  Other:  Heels and 5th digit visualized prev. IVC/SVC, 3VV and 3VTV          visualized. Nasal bone visualized. Midline falx and cerebellar vermis          visualized. Maxilla, mandible and tongue visualized. Lungs and neck          visualized. ---------------------------------------------------------------------- Cervix Uterus Adnexa  Cervix  Not visualized (advanced GA >24wks) ---------------------------------------------------------------------- Comments  This patient was seen for a follow up growth scan due to  renal pelvis dilatation that was noted during her prior  ultrasound exams.  She denies any problems since her last  exam.  She was informed that the fetal growth and amniotic fluid  level appears appropriate for her gestational age.  Mild bilateral pyelectasis measuring 0.7 cm on the right and  1.0 cm on the left continues to be noted on today's exam.  The patient was advised that the renal pelvis dilatation will  most likely resolve after birth.  She was advised to notify her  pediatrician regarding the mild bilateral pyelectasis that was  noted during her prenatal ultrasounds.  Her pediatrician will  order additional imaging studies of the baby's kidneys after  birth if necessary.  Follow-up as indicated. ----------------------------------------------------------------------                   Johnell Comings, MD Electronically Signed Final Report   04/04/2019 10:02 am  ----------------------------------------------------------------------   Assessment and Plan:  Pregnancy: G1P0000 at [redacted]w[redacted]d 1. Encounter for supervision of low-risk pregnancy in third trimester -Anticipatory guidance for upcoming appts. -Discussed induction of labor at 41 weeks with foley bulb placement outpatient the day before. -Information placed in AVS regarding outpatient fb placement. -Patient reassured that it is okay to continue working until delivery. -Patient questions when baby will come and informed that provider doesn't know! -No other questions or concerns. -Will scheduled for IOL on 3/12 at [facility]; BS scheduled checked and openings available.   -Plan for visit on  3/11 in the afternoon for NST and foley bulb placement.  Term labor symptoms and general obstetric precautions including but not limited to vaginal bleeding, contractions, leaking of fluid and fetal movement were reviewed in detail with the patient. I discussed the assessment and treatment plan with the patient. The patient was provided an opportunity to ask questions and all were answered. The patient agreed with the plan and demonstrated an understanding of the instructions. The patient was advised to call back or seek an in-person office evaluation/go to MAU at [facility] for any urgent or concerning symptoms. Please refer to After Visit Summary for other counseling recommendations.   I provided 9 minutes of face-to-face time during this encounter.  No follow-ups on file.  No future appointments.  Cherre Robins, CNM [facility], [facility]

## 2019-04-27 ENCOUNTER — Inpatient Hospital Stay (HOSPITAL_COMMUNITY): Payer: Medicaid Other | Admitting: Anesthesiology

## 2019-04-27 ENCOUNTER — Encounter (HOSPITAL_COMMUNITY): Payer: Self-pay | Admitting: Family Medicine

## 2019-04-27 LAB — RPR: RPR Ser Ql: NONREACTIVE

## 2019-04-27 LAB — RESPIRATORY PANEL BY RT PCR (FLU A&B, COVID)
Influenza A by PCR: NEGATIVE
Influenza B by PCR: NEGATIVE
SARS Coronavirus 2 by RT PCR: NEGATIVE

## 2019-04-27 MED ORDER — SODIUM CHLORIDE (PF) 0.9 % IJ SOLN
INTRAMUSCULAR | Status: DC | PRN
  Administered 2019-04-27: 12 mL/h via EPIDURAL

## 2019-04-27 MED ORDER — TETANUS-DIPHTH-ACELL PERTUSSIS 5-2.5-18.5 LF-MCG/0.5 IM SUSP: 0.5000 mL | Freq: Once | INTRAMUSCULAR | Status: DC

## 2019-04-27 MED ORDER — ACETAMINOPHEN 325 MG PO TABS
650.0000 mg | ORAL_TABLET | ORAL | Status: DC | PRN
  Administered 2019-04-27 – 2019-04-29 (×5): 650 mg via ORAL
  Filled 2019-04-27 (×5): qty 2

## 2019-04-27 MED ORDER — BENZOCAINE-MENTHOL 20-0.5 % EX AERO: 1.0000 "application " | INHALATION_SPRAY | CUTANEOUS | Status: DC | PRN

## 2019-04-27 MED ORDER — SENNOSIDES-DOCUSATE SODIUM 8.6-50 MG PO TABS
2.0000 | ORAL_TABLET | ORAL | Status: DC
  Administered 2019-04-27: 2 via ORAL
  Filled 2019-04-27 (×2): qty 2

## 2019-04-27 MED ORDER — PRENATAL MULTIVITAMIN CH
1.0000 | ORAL_TABLET | Freq: Every day | ORAL | Status: DC
  Administered 2019-04-28 – 2019-04-29 (×2): 1 via ORAL
  Filled 2019-04-27 (×2): qty 1

## 2019-04-27 MED ORDER — IBUPROFEN 600 MG PO TABS
600.0000 mg | ORAL_TABLET | Freq: Four times a day (QID) | ORAL | Status: DC
  Administered 2019-04-27 – 2019-04-29 (×7): 600 mg via ORAL
  Filled 2019-04-27 (×8): qty 1

## 2019-04-27 MED ORDER — WITCH HAZEL-GLYCERIN EX PADS: 1.0000 "application " | MEDICATED_PAD | CUTANEOUS | Status: DC | PRN

## 2019-04-27 MED ORDER — ONDANSETRON HCL 4 MG PO TABS: 4.0000 mg | ORAL_TABLET | ORAL | Status: DC | PRN

## 2019-04-27 MED ORDER — DIBUCAINE (PERIANAL) 1 % EX OINT: 1.0000 "application " | TOPICAL_OINTMENT | CUTANEOUS | Status: DC | PRN

## 2019-04-27 MED ORDER — SIMETHICONE 80 MG PO CHEW: 80.0000 mg | CHEWABLE_TABLET | ORAL | Status: DC | PRN

## 2019-04-27 MED ORDER — ONDANSETRON HCL 4 MG/2ML IJ SOLN: 4.0000 mg | INTRAMUSCULAR | Status: DC | PRN

## 2019-04-27 MED ORDER — COCONUT OIL OIL: 1.0000 "application " | TOPICAL_OIL | Status: DC | PRN

## 2019-04-27 MED ORDER — DIPHENHYDRAMINE HCL 25 MG PO CAPS: 25.0000 mg | ORAL_CAPSULE | Freq: Four times a day (QID) | ORAL | Status: DC | PRN

## 2019-04-27 MED ORDER — ZOLPIDEM TARTRATE 5 MG PO TABS: 5.0000 mg | ORAL_TABLET | Freq: Every evening | ORAL | Status: DC | PRN

## 2019-04-27 NOTE — Progress Notes (Signed)
LABOR PROGRESS NOTE  Allison Robles is a 24 y.o. G1P0000 at [redacted]w[redacted]d  admitted for latent labor, deceleration on admission   Subjective: Patient doing well, comfortable with epidural   Objective: BP 94/73   Pulse 79   Temp (!) 97.5 F (36.4 C) (Oral)   Resp 16   Ht 5\' 6"  (1.676 m)   Wt 70.7 kg   SpO2 100%   BMI 25.15 kg/m  or  Vitals:   04/27/19 0811 04/27/19 0826 04/27/19 0831 04/27/19 0832  BP:    94/73  Pulse:    79  Resp:    16  Temp:      TempSrc:      SpO2: 100% 100% 100%   Weight:      Height:        Currently on 8 milli-unit/min of pitocin  Dilation: 7 Effacement (%): 90 Cervical Position: Posterior Station: -1 Presentation: Vertex Exam by:: Jonny Longino,CNM FHT: baseline rate 115, moderate varibility, +accel, variable decel Toco: 2-3/ moderate by palpation  MVU: 130-180  Labs: Lab Results  Component Value Date   WBC 15.9 (H) 04/26/2019   HGB 11.5 (L) 04/26/2019   HCT 38.2 04/26/2019   MCV 78.8 (L) 04/26/2019   PLT 295 04/26/2019    Patient Active Problem List   Diagnosis Date Noted  . Indication for care or intervention in labor or delivery 04/26/2019  . Alpha thalassemia silent carrier 01/14/2019  . Fetal abnormality during pregnancy, antepartum 01/05/2019  . Elevated liver enzymes 12/21/2018  . Supervision of low-risk pregnancy 11/17/2018    Assessment / Plan: 24 y.o. G1P0000 at [redacted]w[redacted]d here for IOL for fetal decelerations in MAU, latent labor   Labor: Progressing well on pitocin, continue to titrate pitocin  Fetal Wellbeing:  Cat II  Pain Control:  Epidural  Anticipated MOD:  SVD  [redacted]w[redacted]d, CNM 04/27/2019, 9:51 AM

## 2019-04-27 NOTE — Anesthesia Preprocedure Evaluation (Signed)
Anesthesia Evaluation  Patient identified by MRN, date of birth, ID band Patient awake    Reviewed: Allergy & Precautions, H&P , NPO status , Patient's Chart, lab work & pertinent test results  Airway Mallampati: I  TM Distance: >3 FB Neck ROM: full    Dental no notable dental hx. (+) Teeth Intact   Pulmonary former smoker,    Pulmonary exam normal breath sounds clear to auscultation       Cardiovascular negative cardio ROS Normal cardiovascular exam Rhythm:regular Rate:Normal     Neuro/Psych negative neurological ROS  negative psych ROS   GI/Hepatic negative GI ROS, Neg liver ROS,   Endo/Other  negative endocrine ROS  Renal/GU negative Renal ROS     Musculoskeletal   Abdominal Normal abdominal exam  (+)   Peds  Hematology  (+) Blood dyscrasia, anemia ,   Anesthesia Other Findings   Reproductive/Obstetrics (+) Pregnancy                             Anesthesia Physical Anesthesia Plan  ASA: II  Anesthesia Plan: Epidural   Post-op Pain Management:    Induction:   PONV Risk Score and Plan:   Airway Management Planned:   Additional Equipment:   Intra-op Plan:   Post-operative Plan:   Informed Consent: I have reviewed the patients History and Physical, chart, labs and discussed the procedure including the risks, benefits and alternatives for the proposed anesthesia with the patient or authorized representative who has indicated his/her understanding and acceptance.       Plan Discussed with:   Anesthesia Plan Comments:         Anesthesia Quick Evaluation

## 2019-04-27 NOTE — Discharge Summary (Signed)
Postpartum Discharge Summary     Patient Name: Allison Robles DOB: 11/06/1995 MRN: [id number]  Date of admission: 04/26/2019 Delivering Provider: Lajean Manes   Date of discharge: 04/29/2019  Admitting diagnosis: Indication for care or intervention in labor or delivery [O75.9] Intrauterine pregnancy: [redacted]w[redacted]d    Secondary diagnosis:  Active Problems:   Indication for care or intervention in labor or delivery   SVD (spontaneous vaginal delivery)  Additional problems: fetal abnormality during pregnancy- bilateral UTD     Discharge diagnosis: Term Pregnancy Delivered                                                                                                Post partum procedures:none  Augmentation: Pitocin  Complications: None  Hospital course:  Onset of Labor With Vaginal Delivery     24y.o. yo G1P0000 at 347w5das admitted in Latent Labor on 04/26/2019. Patient had an uncomplicated labor course as follows:  Membrane Rupture Time/Date: 4:19 AM ,04/27/2019   Intrapartum Procedures: Episiotomy: None [1]                                         Lacerations:  None [1]  Patient had a delivery of a Viable infant. 04/27/2019  Information for the patient's newborn:  DeYazmina, Pareja0[[id number]]Delivery Method: Vaginal, Spontaneous(Filed from Delivery Summary)     Pateint had an uncomplicated postpartum course.  She is ambulating, tolerating a regular diet, passing flatus, and urinating well. Patient is discharged home in stable condition on 04/29/19.  Delivery time: 12:18 PM    Magnesium Sulfate received: No BMZ received: No Rhophylac:N/A MMR:N/A Transfusion:No  Physical exam  Vitals:   04/27/19 1940 04/28/19 0437 04/28/19 2211 04/29/19 0543  BP: 98/70 115/62 (!) 111/57 (!) 106/56  Pulse: 63 66 71 (!) 56  Resp: _0 Temp: 98.1 F (36.7 C) 98.5 F (36.9 C) 98.3 F (36.8 C) 98.4 F (36.9 C)  TempSrc: Oral Oral Oral Oral  SpO2:   100%   Weight:       Height:       General: alert, cooperative and no distress Lochia: appropriate Uterine Fundus: firm Incision: N/A DVT Evaluation: No evidence of DVT seen on physical exam. No cords or calf tenderness. No significant calf/ankle edema. Labs: Lab Results  Component Value Date   WBC 15.9 (H) 04/26/2019   HGB 11.5 (L) 04/26/2019   HCT 38.2 04/26/2019   MCV 78.8 (L) 04/26/2019   PLT 295 04/26/2019   CMP Latest Ref Rng & Units 02/08/2019  Glucose 65 - 99 mg/dL -  BUN 6 - 20 mg/dL -  Creatinine 0.57 - 1.00 mg/dL -  Sodium 134 - 144 mmol/L -  Potassium 3.5 - 5.2 mmol/L -  Chloride 96 - 106 mmol/L -  CO2 20 - 29 mmol/L -  Calcium 8.7 - 10.2 mg/dL -  Total Protein 6.0 - 8.5 g/dL 6.3  Total Bilirubin 0.0 - 1.2 mg/dL 0.3  Alkaline  Phos 39 - 117 IU/L 96  AST 0 - 40 IU/L 18  ALT 0 - 32 IU/L 16   Edinburgh Score: No flowsheet data found.  Discharge instruction: per After Visit Summary and "Baby and Me Booklet".  After visit meds:  Allergies as of 04/29/2019   No Known Allergies     Medication List    TAKE these medications   Blood Pressure Monitoring Devi 1 Device by Does not apply route once a week.   ibuprofen 600 MG tablet Commonly known as: ADVIL Take 1 tablet (600 mg total) by mouth every 6 (six) hours.   Prenatal Vitamin 27-0.8 MG Tabs Take 1 tablet by mouth daily.   terconazole 0.4 % vaginal cream Commonly known as: TERAZOL 7 Place 1 applicator vaginally at bedtime.       Diet: routine diet  Activity: Advance as tolerated. Pelvic rest for 6 weeks.   Outpatient follow up:4 weeks Follow up Appt: Future Appointments  Date Time Provider [place]  06/02/2019  1:55 PM Rasch, Artist Pais, NP [facility]   Follow up Visit:  Please schedule this patient for Postpartum visit in: 4 weeks with the following provider: Any provider In-Person For C/S patients schedule nurse incision check in weeks 2 weeks: no Low risk pregnancy complicated by: fetal  abnormality during pregnancy  Delivery mode:  SVD Anticipated Birth Control:  OCPs PP Procedures needed: none  Schedule Integrated BH visit: no   Newborn Data: Live born female  Birth Weight:  3325g (7lb 5.3oz) APGAR: 8,9   Newborn Delivery   Birth date/time: 04/27/2019 12:18:00 Delivery type: Vaginal, Spontaneous      Baby Feeding: Breast Disposition:home with mother   04/29/2019 Paulla Dolly, MD  GME ATTESTATION:  I saw and evaluated the patient. I agree with the findings and the plan of care as documented in the resident's note.  Merilyn Baba, DO OB Fellow, [place] [facility] [place] 04/29/2019 7:59 AM

## 2019-04-27 NOTE — Progress Notes (Signed)
Patient ID: Allison Robles, female   DOB: 03-Apr-1995, 24 y.o.   MRN: [id number] Doing well Pitocin started about 11pm  Vitals:   04/27/19 0331 04/27/19 0332 04/27/19 0336 04/27/19 0341  BP:  119/83 120/79 122/70  Pulse:  90 75 77  Resp:  16    Temp:  98.3 F (36.8 C)    TempSrc:  Oral    SpO2: 100% 100% 100% 100%  Weight:      Height:        Pain increased through the night Progressed with intermittent variable decels at times, otherwise reassuring with Average variability and + accelerations.  UCs increasing with Pitocin increases  Got Epidural just a short while ago  Dilation: 4 Effacement (%): 70 Cervical Position: Posterior Station: -2 Presentation: Vertex Exam by:: Deretha Emory RN  Plan to continue Pitocin until labor pattern functional  Anticipate increased progress in dilation of cervix

## 2019-04-27 NOTE — Anesthesia Procedure Notes (Signed)
Epidural Patient location during procedure: OB Start time: 04/27/2019 3:18 AM End time: 04/27/2019 3:21 AM  Staffing Anesthesiologist: Leilani Able, MD Performed: anesthesiologist   Preanesthetic Checklist Completed: patient identified, IV checked, site marked, risks and benefits discussed, surgical consent, monitors and equipment checked, pre-op evaluation and timeout performed  Epidural Patient position: sitting Prep: DuraPrep and site prepped and draped Patient monitoring: continuous pulse ox and blood pressure Approach: midline Location: L3-L4 Injection technique: LOR air  Needle:  Needle type: Tuohy  Needle gauge: 17 G Needle length: 9 cm and 9 Needle insertion depth: 5 cm cm Catheter type: closed end flexible Catheter size: 19 Gauge Catheter at skin depth: 10 cm Test dose: negative and Other  Assessment Events: blood not aspirated, injection not painful, no injection resistance, no paresthesia and negative IV test  Additional Notes Reason for block:procedure for pain

## 2019-04-27 NOTE — Lactation Note (Signed)
This note was copied from a baby's chart. Lactation Consultation Note  Patient Name: Allison Robles [id number] Date: 04/27/2019 Reason for consult: Initial assessment;Term  Initial visit, Mom P1 is laying in bed while infant is resting in the bassinet. Mom stated breastfeeding is going well thus far. Has a few concerns regarding right nipple inverting with compressions. [facility] student assessed the nipple and both breast are soft and compressible. Educated mom on hand expression and colostrum noted on the right breast. Mom taught how to spoon feed infant if needed. Encouraged mom to pre-pump before nursing infant to help elongate right nipple. Milk storage, cleanliness of pump parts, and pump frequency was discussed during this time. Mom stated she will attempt to pre-pump before feeds and feels more confident to feed on the right side since infant favors the left breast.LC praised mom for her efforts. She has a Medela pump at home she plans to use when she returns to work.   Term infant, 7 lbs 5 oz is asleep. Mom stated he has latched successfully with each feeding and reported no signs of any nipple pain. LC encouraged mom to watch for feeding cues. Mom stated she is aware and plans to wake infant up to feed soon.   Reviewed breastfeeding basics and Outpatient consultation services. Mom stated she is aware. [facility] student encouraged mom to call inpatient LC if she has any questions or concerns. No futher questions or concerns reported at this time.   Feeding plan: - feed infant 8-12x's per day or when feeding cues are present -STS -Hand express or spoon feed infant -pre-pump if necessary before feedings    Maternal Data Has patient been taught Hand Expression?: Yes Does the patient have breastfeeding experience prior to this delivery?: No  Feeding Feeding Type: Breast Milk  LATCH Score                   Interventions Interventions: Breast feeding basics reviewed;Hand  express;Pre-pump if needed;Hand pump  Lactation Tools Discussed/Used Tools: Pump Breast pump type: Manual WIC Program: Yes Pump Review: Setup, frequency, and cleaning;Milk Storage Initiated by:: Vear Clock, Lactation Student Date initiated:: 04/27/19   Consult Status Consult Status: Follow-up Date: 04/28/19 Follow-up type: In-patient    Josemiguel Gries 04/27/2019, 9:28 PM

## 2019-04-27 NOTE — Lactation Note (Addendum)
This note was copied from a baby's chart. Lactation Consultation Note Baby 10 hrs old. Mom states baby BF well on the Lt. Nipple but she can't get him latched to the Rt. Nipple. LC attempted to latch baby to Rt. Nipple. Pre-pumped to evert nipple, compresses flat, slightly inverts. In hopes baby would get a hold of it while suckling and everting it in his mouth. Baby frustrated d/t can't get nipple well in his mouth. Fitted #24 NS d/t base of nipple is large.  Taught mom "C" hold for latching.  Baby BF great. Good transfer noted, swallows heard. Mom stated that the cold hurts her nipples. Even w/clothes on if her nipples get cold they hurt. Mom denies painful latches. Mom has great flowing colostrum. Hand expressed 10 ml colostrum. Mom demonstrated hand expression. Milk storage, breast massage, pumping, engorgement, supply and demand discussed.  Mom encouraged to feed baby 8-12 times/24 hours and with feeding cues. Mom encouraged to feed baby w/feeding cues Educated mom on newborn feeding habits, STS, importance of I&O, positioning for feedings, support, and safety while feeding.  Gave mom shells to wear to Rt. Nipple to evert for latching. Mom is to pre-pump Rt. Nipple. Mom has to wear a NS for latching, in hopes to wean off NS after nipple everts well for latching.  Mom shown how to use DEBP & how to disassemble, clean, & reassemble parts. Mom knows to pump q3h for 15-20 min.  Encouraged mom to call for assistance or questions. Lactation brochure given.  Feeding Plan: Mom is to wear shells during the day to Rt. Nipple. Pre-pump Rt. Nipple to evert if needed to apply #24 NS. When nipple everts well for latching may stop wearing shells and NS. BF baby w/cues. Use DEBP every 3 hrs if not cluster feeding while using NS. Assess breast for transfer, occasionally massage breast w/feedings if doesn't have forceful let down.  Patient Name: Allison Robles [id number] Date: 04/27/2019 Reason  for consult: Initial assessment;Primapara;Term   Maternal Data Has patient been taught Hand Expression?: Yes Does the patient have breastfeeding experience prior to this delivery?: No  Feeding Feeding Type: Breast Fed  LATCH Score Latch: Grasps breast easily, tongue down, lips flanged, rhythmical sucking.  Audible Swallowing: Spontaneous and intermittent  Type of Nipple: Everted at rest and after stimulation(Lt. nipple everted, Rt. nipple flattens when compressed-inverts)  Comfort (Breast/Nipple): Soft / non-tender  Hold (Positioning): Assistance needed to correctly position infant at breast and maintain latch.  LATCH Score: 9  Interventions Interventions: Breast feeding basics reviewed;Support pillows;Assisted with latch;Position options;Skin to skin;Expressed milk;Breast massage;Hand express;Shells;Pre-pump if needed;Hand pump;DEBP;Breast compression;Adjust position  Lactation Tools Discussed/Used Tools: Shells;Pump;Nipple Shields Nipple shield size: 24 Shell Type: Inverted Breast pump type: Manual;Double-Electric Breast Pump WIC Program: Yes Pump Review: Setup, frequency, and cleaning;Milk Storage Initiated by:: RN Date initiated:: 04/27/19   Consult Status Consult Status: Follow-up Date: 04/28/19 Follow-up type: In-patient    Charyl Dancer 04/27/2019, 10:52 PM

## 2019-04-27 NOTE — Plan of Care (Signed)
A.Tonnie Stillman, RN 

## 2019-04-28 ENCOUNTER — Encounter (HOSPITAL_COMMUNITY): Payer: Self-pay | Admitting: Family Medicine

## 2019-04-28 NOTE — Clinical Social Work Maternal (Signed)
CLINICAL SOCIAL WORK MATERNAL/CHILD NOTE  Patient Details  Name: Allison Robles MRN: [id number] Date of Birth: 08/11/1995  Date:  June 17, 2019  Clinical Social Worker Initiating Note:  Elijio Miles Date/Time: Initiated:  04/28/19/0102     Child's Name:  Allison Robles   Biological Parents:  Mother, Father(Allison Robles DOB: 10/23/1995)   Need for Interpreter:  None   Reason for Referral:  Current Substance Use/Substance Use During Pregnancy    Address:  [address] [place] [address]    Phone number:  [phone] (home)     Additional phone number:   Household Members/Support Persons (HM/SP):   Household Member/Support Person 1   HM/SP Name Relationship DOB or Age  HM/SP -1 Allison Robles FOB 10/23/1995  HM/SP -2        HM/SP -3        HM/SP -4        HM/SP -5        HM/SP -6        HM/SP -7        HM/SP -8          Natural Supports (not living in the home):  Parent, Spouse/significant other   Professional Supports: None   Employment: Part-time   Type of Work: Financial planner   Education:  [employer] school graduate   Homebound arranged:    Museum/gallery curator Resources:      Other Resources:  Physicist, medical , [facility]   Cultural/Religious Considerations Which May Impact Care:    Strengths:  Ability to meet basic needs , Home prepared for child , Pediatrician chosen   Psychotropic Medications:         Pediatrician:    [place] area  Pediatrician List:   [facility]      Pediatrician Fax Number:    Risk Factors/Current Problems:  Substance Use    Cognitive State:  Able to Concentrate , Alert , Linear Thinking    Mood/Affect:  Bright , Calm , Comfortable , Happy , Interested , Relaxed    CSW Assessment:  CSW received consult for [facility] in early pregnancy.  CSW met with MOB to offer support and complete assessment.     MOB sitting up in bed attempting to breastfeed infant with FOB present at bedside, when CSW entered the room. CSW introduced self and received verbal permission to complete assessment with FOB present. CSW explained reason for consult to which MOB expressed understanding. MOB and FOB both very pleasant and easy to engage throughout assessment. MOB reported she and FOB currently live together and she works part-time at [employer]. MOB confirmed she receives both [facility] and food stamps and is aware she needs to reach out to both to get infant added on. CSW inquired about MOB's mental health history to which MOB denied having any. CSW provided education regarding the baby blues period vs. perinatal mood disorders, discussed treatment and gave resources for mental health follow up if concerns arise. CSW recommended self-evaluation during the postpartum time period using the New Mom Checklist from Postpartum Progress and encouraged MOB to contact a medical professional if symptoms are noted at any time. MOB did not appear to be displaying any acute mental health symptoms and denied any current SI or HI. MOB reported having good support from FOB and her mother. MOB and FOB confirmed  having all essential items for infant once discharged and stated infant would be sleeping in a bassinet once home. CSW provided review of Sudden Infant Death Syndrome (SIDS) precautions and safe sleeping habits.    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 Per MOB, she discontinued use after pregnancy was confirmed. CSW informed MOB of Hospital Drug Policy and explained CDS would continue to be monitored and a CPS report would be made, if warranted. MOB denied any questions or concerns regarding policy.  CSW to continue to monitor CDS results and make CPS report, if needed.   CSW Plan/Description:  No Further Intervention Required/No Barriers to Discharge, Sudden Infant Death  Syndrome (SIDS) Education, Perinatal Mood and Anxiety Disorder (PMADs) Education, Hospital Drug Screen Policy Information, CSW Will Continue to Monitor Umbilical Cord Tissue Drug Screen Results and Make Report if Warranted    Zani Kyllonen, LCSW 04/28/2019, 1:46 PM  

## 2019-04-28 NOTE — Anesthesia Postprocedure Evaluation (Signed)
Anesthesia Post Note  Patient: Allison Robles  Procedure(s) Performed: AN AD HOC LABOR EPIDURAL     Patient location during evaluation: Mother Baby Anesthesia Type: Epidural Level of consciousness: awake and alert Pain management: pain level controlled Vital Signs Assessment: post-procedure vital signs reviewed and stable Respiratory status: spontaneous breathing, nonlabored ventilation and respiratory function stable Cardiovascular status: stable Postop Assessment: no headache, no backache and epidural receding Anesthetic complications: no    Last Vitals:  Vitals:   04/27/19 1940 04/28/19 0437  BP: 98/70 (P) 115/62  Pulse: 63 (P) 66  Resp: 16 (P) 14  Temp: 36.7 C (P) 36.9 C  SpO2:      Last Pain:  Vitals:   04/28/19 0718  TempSrc:   PainSc: Asleep   Pain Goal: Patients Stated Pain Goal: 0 (04/26/19 2150)                 Marrion Coy

## 2019-04-28 NOTE — Progress Notes (Addendum)
Patient ID: Allison Robles, female   DOB: 01-Jan-1996, 24 y.o.   MRN: [id number]  POSTPARTUM PROGRESS NOTE  Post Partum Day 1  Subjective:  Allison Robles is a 24 y.o. G1P1001 s/p SVD at [redacted]w[redacted]d.  She reports she is doing well. No acute events overnight. She denies any problems with ambulating, voiding or po intake. Denies nausea or vomiting.  Pain is well controlled.  Lochia is appropriate.  Objective: Blood pressure (P) 115/62, pulse (P) 66, temperature (P) 98.5 F (36.9 C), temperature source (P) Oral, resp. rate (P) 14, height 5\' 6"  (1.676 m), weight 70.7 kg, SpO2 100 %, unknown if currently breastfeeding.  Physical Exam:  General: alert, cooperative and no distress Chest: no respiratory distress Heart:regular rate, distal pulses intact Abdomen: soft, nontender Uterine Fundus: firm, appropriately tender DVT Evaluation: No calf swelling or tenderness Extremities: trace edema Skin: warm, dry  Recent Labs    04/26/19 2137  HGB 11.5*  HCT 38.2    Assessment/Plan: Allison Robles is a 24 y.o. G1P1001 s/p SVD at [redacted]w[redacted]d   PPD#1 - Doing well. Continue routine postpartum care.  Contraception: OCP's Feeding: Breast Dispo: Plan for discharge tomorrow.   LOS: 2 days    [redacted]w[redacted]d MD, PGY-1 OBGYN Faculty Teaching Service  04/28/2019, 7:41 AM  I saw and evaluated the patient. I agree with the findings and the plan of care as documented in the resident's note. Few mild-range BP's. Asymptomatic. Last BP low-normal. Will cont to monitor. Discussed possible need for anti-hypertensive on discharge. Possibly considering Depo at 6 wk visit.    06/28/2019, MD [facility] Family Medicine Fellow, [facility] for [facility]

## 2019-04-29 LAB — SURGICAL PATHOLOGY

## 2019-04-29 MED ORDER — IBUPROFEN 600 MG PO TABS: 600.0000 mg | ORAL_TABLET | Freq: Four times a day (QID) | ORAL | 0 refills | Status: DC

## 2019-04-29 NOTE — Discharge Instructions (Signed)

## 2019-04-29 NOTE — Lactation Note (Signed)
This note was copied from a baby's chart. Lactation Consultation Note  Patient Name: Allison Robles Today's Date: 04/29/2019 Reason for consult: Follow-up assessment;Primapara;1st time breastfeeding;Term;Difficult latch  Baby is 46 hours old at 10:30 am .  Per mom latching on the right with a MS due to inverted nipple.  LC offered to assess breast tissue and noted the right is semi - inverted  Compressible . LC recommended trying to latch with out the NS and baby latched  And only stayed 5 mins . Few swallows.  Mom applied the NS and was not secure and LC showed mom how to make it more secure. Baby latched for 8 mins and colostrum noted in the NS and swallows.  Nipple less inverted when baby released.  Dr. Haddix into exam baby. After exam assisted to latch on the left breast.  Baby fed 15 mins with depth and per mom comfortable.  Sore nipple and engorgement prevention and tx. Mom has shells , hand pump and DEBP kit from hospital. Per  Mom  has DEBP at home.  LC recommended post pumping after 4-5 feedings 10 -15 mins . Use 4ml of EBM to instill in the NS.  LC sent a message to LC Maryann Joesph to see mom and baby Monday appt .      Maternal Data Has patient been taught Hand Expression?: Yes  Feeding Feeding Type: Breast Fed  LATCH Score Latch: Repeated attempts needed to sustain latch, nipple held in mouth throughout feeding, stimulation needed to elicit sucking reflex.(Needed stimulation at first then became intermittent)  Audible Swallowing: Spontaneous and intermittent  Type of Nipple: Everted at rest and after stimulation  Comfort (Breast/Nipple): Soft / non-tender(Left breast)  Hold (Positioning): Assistance needed to correctly position infant at breast and maintain latch.  LATCH Score: 8  Interventions Interventions: Breast feeding basics reviewed;Assisted with latch;Skin to skin;Breast massage;Hand express;Reverse pressure;Breast compression;Adjust  position;Support pillows;Position options  Lactation Tools Discussed/Used Tools: Shells;Pump;Flanges;Nipple Shields Nipple shield size: 24(# 24 NS fits) Flange Size: 24 Shell Type: Inverted Breast pump type: Manual;Double-Electric Breast Pump Pump Review: Milk Storage;Setup, frequency, and cleaning Initiated by:: LC Reviewed Date initiated:: 04/29/19   Consult Status Consult Status: Follow-up(LC recommended at appt on Monday to check for LC visit) Date: 04/29/19 Follow-up type: Out-patient    Margaret Ann Iorio 04/29/2019, 12:28 PM    

## 2019-04-29 NOTE — Lactation Note (Signed)
This note was copied from a baby's chart. Lactation Consultation Note Baby 55 hrs old. FOB holding baby STS, mom just finished pumping now washing pump. Mom pumped 40 ml. Milk storage and warming milk discussed. Encouraged mom to put baby to the breast when cueing. Reminded mom of what and why baby is cluster feeding. Encouraged mom to give her milk instead of formula. Answered several questions. Discussed using NS to that one breast.  Encouraged to call for assistance or questions.  Patient Name: Allison Robles [id number] Date: 04/29/2019 Reason for consult: Follow-up assessment;Primapara;Term   Maternal Data Has patient been taught Hand Expression?: Yes Does the patient have breastfeeding experience prior to this delivery?: No  Feeding    LATCH Score                   Interventions    Lactation Tools Discussed/Used     Consult Status Consult Status: Follow-up Date: 04/29/19 Follow-up type: In-patient    Charyl Dancer 04/29/2019, 12:31 AM

## 2019-05-05 ENCOUNTER — Inpatient Hospital Stay (HOSPITAL_COMMUNITY)
Admission: AD | Admit: 2019-05-05 | Payer: Medicaid Other | Source: Home / Self Care | Admitting: Obstetrics and Gynecology

## 2019-05-05 ENCOUNTER — Encounter: Payer: Medicaid Other | Admitting: Obstetrics and Gynecology

## 2019-05-05 ENCOUNTER — Inpatient Hospital Stay (HOSPITAL_COMMUNITY): Payer: Medicaid Other

## 2019-05-05 ENCOUNTER — Other Ambulatory Visit: Payer: Medicaid Other

## 2019-06-02 ENCOUNTER — Ambulatory Visit: Payer: Medicaid Other | Admitting: Obstetrics and Gynecology

## 2019-06-07 ENCOUNTER — Ambulatory Visit: Payer: Medicaid Other | Admitting: Nurse Practitioner

## 2019-10-05 ENCOUNTER — Encounter (HOSPITAL_COMMUNITY): Payer: Self-pay | Admitting: Obstetrics and Gynecology

## 2019-10-05 ENCOUNTER — Other Ambulatory Visit: Payer: Self-pay

## 2019-10-05 ENCOUNTER — Inpatient Hospital Stay (HOSPITAL_COMMUNITY)
Admission: AD | Admit: 2019-10-05 | Discharge: 2019-10-05 | Disposition: A | Discharge status: Home / Self Care | Payer: Medicaid Other | Attending: Obstetrics and Gynecology | Admitting: Obstetrics and Gynecology

## 2019-10-05 DIAGNOSIS — O99891 Other specified diseases and conditions complicating pregnancy: Secondary | ICD-10-CM | POA: Diagnosis not present

## 2019-10-05 DIAGNOSIS — N760 Acute vaginitis: Secondary | ICD-10-CM

## 2019-10-05 DIAGNOSIS — O219 Vomiting of pregnancy, unspecified: Secondary | ICD-10-CM | POA: Diagnosis present

## 2019-10-05 DIAGNOSIS — R519 Headache, unspecified: Secondary | ICD-10-CM | POA: Diagnosis not present

## 2019-10-05 DIAGNOSIS — R109 Unspecified abdominal pain: Secondary | ICD-10-CM | POA: Insufficient documentation

## 2019-10-05 DIAGNOSIS — Z87891 Personal history of nicotine dependence: Secondary | ICD-10-CM | POA: Insufficient documentation

## 2019-10-05 DIAGNOSIS — Z3A09 9 weeks gestation of pregnancy: Secondary | ICD-10-CM | POA: Diagnosis not present

## 2019-10-05 DIAGNOSIS — O23591 Infection of other part of genital tract in pregnancy, first trimester: Secondary | ICD-10-CM | POA: Diagnosis not present

## 2019-10-05 DIAGNOSIS — O26891 Other specified pregnancy related conditions, first trimester: Secondary | ICD-10-CM | POA: Insufficient documentation

## 2019-10-05 DIAGNOSIS — O09899 Supervision of other high risk pregnancies, unspecified trimester: Secondary | ICD-10-CM

## 2019-10-05 DIAGNOSIS — B9689 Other specified bacterial agents as the cause of diseases classified elsewhere: Secondary | ICD-10-CM | POA: Insufficient documentation

## 2019-10-05 LAB — URINALYSIS, ROUTINE W REFLEX MICROSCOPIC
Bilirubin Urine: NEGATIVE
Glucose, UA: NEGATIVE mg/dL
Hgb urine dipstick: NEGATIVE
Ketones, ur: NEGATIVE mg/dL
Nitrite: NEGATIVE
Protein, ur: NEGATIVE mg/dL
Specific Gravity, Urine: 1.009 (ref 1.005–1.030)
pH: 7 (ref 5.0–8.0)

## 2019-10-05 LAB — POCT PREGNANCY, URINE: Preg Test, Ur: POSITIVE — AB

## 2019-10-05 LAB — WET PREP, GENITAL
Sperm: NONE SEEN
Trich, Wet Prep: NONE SEEN
Yeast Wet Prep HPF POC: NONE SEEN

## 2019-10-05 MED ORDER — METRONIDAZOLE 500 MG PO TABS: 500.0000 mg | ORAL_TABLET | Freq: Two times a day (BID) | ORAL | 0 refills | Status: AC

## 2019-10-05 NOTE — Discharge Instructions (Signed)
-  ensure you are staying hydrated to avoid headaches, 64-100oz water daily -can take medications below for headache (tylenol 1000 mg every 6-8 hours) -ensure you are getting adequate sleep -schedule initial OB visit -make sure you are taking your prenatals -unasom and b6 for nausea -small frequent meals -flagyl 500 mg twice daily for bacterial vaginosis, take with food  Safe Medications in Pregnancy   Acne:  Benzoyl Peroxide  Salicylic Acid   Backache/Headache:  Tylenol: 2 regular strength every 4 hours OR        2 Extra strength every 6 hours   Colds/Coughs/Allergies:  Benadryl (alcohol free) 25 mg every 6 hours as needed  Breath right strips  Claritin  Cepacol throat lozenges  Chloraseptic throat spray  Cold-Eeze- up to three times per day  Cough drops, alcohol free  Flonase (by prescription only)  Guaifenesin  Mucinex  Robitussin DM (plain only, alcohol free)  Saline nasal spray/drops  Sudafed (pseudoephedrine) & Actifed * use only after [redacted] weeks gestation and if you do not have high blood pressure  Tylenol  Vicks Vaporub  Zinc lozenges  Zyrtec   Constipation:  Colace  Ducolax suppositories  Fleet enema  Glycerin suppositories  Metamucil  Milk of magnesia  Miralax  Senokot  Smooth move tea   Diarrhea:  Kaopectate  Imodium A-D   *NO pepto Bismol   Hemorrhoids:  Anusol  Anusol HC  Preparation H  Tucks   Indigestion:  Tums  Maalox  Mylanta  Zantac  Pepcid   Insomnia:  Benadryl (alcohol free) 25mg  every 6 hours as needed  Tylenol PM  Unisom, no Gelcaps   Leg Cramps:  Tums  MagGel   Nausea/Vomiting:  Bonine  Dramamine  Emetrol  Ginger extract  Sea bands  Meclizine  Nausea medication to take during pregnancy:  Unisom (doxylamine succinate 25 mg tablets) Take one tablet daily at bedtime. If symptoms are not adequately controlled, the dose can be increased to a maximum recommended dose of two tablets daily (1/2 tablet in the  morning, 1/2 tablet mid-afternoon and one at bedtime).  Vitamin B6 100mg  tablets. Take one tablet twice a day (up to 200 mg per day).   Skin Rashes:  Aveeno products  Benadryl cream or 25mg  every 6 hours as needed  Calamine Lotion  1% cortisone cream   Yeast infection:  Gyne-lotrimin 7  Monistat 7    **If taking multiple medications, please check labels to avoid duplicating the same active ingredients  **take medication as directed on the label  ** Do not exceed 4000 mg of tylenol in 24 hours  **Do not take medications that contain aspirin or ibuprofen

## 2019-10-05 NOTE — MAU Provider Note (Signed)
History     [id number]  Arrival date and time: 10/05/19 8242    Chief Complaint  Patient presents with  . Emesis  . Abdominal Pain  . Nausea     HPI Allison Robles is a 24 y.o. at [redacted]w[redacted]d by unsure LMP with PMHx notable for short interval pregnancy (delivered 04/27/19), alpha thal silent carrier, who presents for nausea/emesis/abdominal pain/vaginal discharge. Her biggest concern today is her sharp abdominal pain, that started last night, left sided. Intermittent in nature, episodes last a few minutes. She has not taken any medication for this issue. She does endorse she has not been drinking much water. No vaginal bleeding. She does endorse increased vaginal discharge, no associated odor. She denies hematuria, urgency, frequency. She had a similar pain with her last pregnancy. Pain only present when she moves around at work and then subsides. Last night she was nauseas and had 1 episode of emesis. Otherwise she has been tolerating PO without difficulty. She additionally is complaining of headaches today, improved with sleep. No associated vision changes. No lightheadedness or dizziness. No hypertension in prior pregnancy. Not on any meds. No medical problems. No alcohol or dugs. Not taking PNV. Desires work note.   Review of discharge summary from last admission from prior delivery. Reviewed all prior imaging/labs.    --/--/O POS, O POS Performed at [facility], [address], [place], [place] [address]  (03/02 2137)  OB History    Gravida  2   Para  1   Term  1   Preterm  0   AB  0   Living  1     SAB  0   TAB  0   Ectopic  0   Multiple  0   Live Births  1           Past Medical History:  Diagnosis Date  . Anemia   . Bacterial meningitis    as newborn, has hearing loss in left ear  . Medical history non-contributory     Past Surgical History:  Procedure Laterality Date  . NO PAST SURGERIES      History reviewed. No pertinent family  history.  Social History   Socioeconomic History  . Marital status: Single    Spouse name: Not on file  . Number of children: Not on file  . Years of education: Not on file  . Highest education level: Not on file  Occupational History  . Not on file  Tobacco Use  . Smoking status: Former Smoker    Types: Cigars    Quit date: 09/26/2018    Years since quitting: 1.0  . Smokeless tobacco: Never Used  . Tobacco comment: Pt smokes 3 black and milds a day  Vaping Use  . Vaping Use: Never used  Substance and Sexual Activity  . Alcohol use: No  . Drug use: Yes    Types: Marijuana    Comment: Last use June 2021  . Sexual activity: Yes    Birth control/protection: Condom  Other Topics Concern  . Not on file  Social History Narrative  . Not on file   Social Determinants of Health   Financial Resource Strain:   . Difficulty of Paying Living Expenses:   Food Insecurity:   . Worried About Programme researcher, broadcasting/film/video in the Last Year:   . Barista in the Last Year:   Transportation Needs:   . Freight forwarder (Medical):   Marland Kitchen Lack  of Transportation (Non-Medical):   Physical Activity:   . Days of Exercise per Week:   . Minutes of Exercise per Session:   Stress:   . Feeling of Stress :   Social Connections:   . Frequency of Communication with Friends and Family:   . Frequency of Social Gatherings with Friends and Family:   . Attends Religious Services:   . Active Member of Clubs or Organizations:   . Attends Banker Meetings:   Marland Kitchen Marital Status:   Intimate Partner Violence:   . Fear of Current or Ex-Partner:   . Emotionally Abused:   Marland Kitchen Physically Abused:   . Sexually Abused:     No Known Allergies  No current facility-administered medications on file prior to encounter.   Current Outpatient Medications on File Prior to Encounter  Medication Sig Dispense Refill  . Blood Pressure Monitoring DEVI 1 Device by Does not apply route once a week. 1 Device 0   . ibuprofen (ADVIL) 600 MG tablet Take 1 tablet (600 mg total) by mouth every 6 (six) hours. 30 tablet 0  . Prenatal Vit-Fe Fumarate-FA (PRENATAL VITAMIN) 27-0.8 MG TABS Take 1 tablet by mouth daily. 30 tablet 1  . terconazole (TERAZOL 7) 0.4 % vaginal cream Place 1 applicator vaginally at bedtime.       ROS Pertinent positives and negative per HPI, all others reviewed and negative  Physical Exam   BP 117/68 (BP Location: Right Arm)   Pulse 80   Temp 98.1 F (36.7 C) (Oral)   Resp 18   LMP 08/03/2019 (Approximate)   SpO2 100%   Physical Exam Vitals and nursing note reviewed. Exam conducted with a chaperone present.  Constitutional:      General: She is not in acute distress.    Appearance: Normal appearance. She is normal weight.  HENT:     Head: Normocephalic and atraumatic.     Nose: Nose normal.     Mouth/Throat:     Mouth: Mucous membranes are moist.     Pharynx: Oropharynx is clear.  Eyes:     Extraocular Movements: Extraocular movements intact.     Conjunctiva/sclera: Conjunctivae normal.  Cardiovascular:     Rate and Rhythm: Normal rate.     Pulses: Normal pulses.  Pulmonary:     Effort: Pulmonary effort is normal.  Abdominal:     General: There is no distension or abdominal bruit.     Tenderness: There is no abdominal tenderness. There is no right CVA tenderness, left CVA tenderness, guarding or rebound.  Musculoskeletal:        General: Normal range of motion.     Cervical back: Normal range of motion and neck supple.  Skin:    General: Skin is warm and dry.  Neurological:     General: No focal deficit present.     Mental Status: She is alert and oriented to person, place, and time. Mental status is at baseline.  Psychiatric:        Mood and Affect: Mood normal.        Behavior: Behavior normal.     Cervical Exam  not indicated  Bedside Ultrasound Not done  My interpretation: n/a  FHT Too early  Labs Results for orders placed or performed  during the hospital encounter of 10/05/19 (from the past 24 hour(s))  Pregnancy, urine POC     Status: Abnormal   Collection Time: 10/05/19  9:32 AM  Result Value Ref Range   Preg Test,  Ur POSITIVE (A) NEGATIVE    Imaging No results found.  MAU Course  Procedures  Lab Orders     Urinalysis, Routine w reflex microscopic Urine, Clean Catch     Pregnancy, urine POC No orders of the defined types were placed in this encounter.  Imaging Orders  No imaging studies ordered today    MDM moderate  Assessment and Plan  24 yo G2P1001 at [redacted]w[redacted]d by unsure LMP presents to MAU for evaluation of nausea, emesis, vaginal discharge and abdominal cramping.  #First trimester nausea/emesis #Bacterial vaginosis Patient presents to the MAU with a multitude of complaints. Her main concern is some intermittent abdominal cramping that lasts a few minutes and only occurs when she is working, otherwise some vaginal discharge. She also has had nausea and emesis. She stated she had similar symptoms with her last pregnancy and this is how she knew she was pregnant. Wet prep demonstrated clue cells, likely the etiology of patient's symptoms at this time, gcc pending, low suspicion for STI at this time, suspect more likely physiologic discharge of pregnancy. No urinary symptoms, UA unremarkable, low suspicion for UTI at this time. Discussed her abdominal pain is more likely musculoskeletal in nature given it only occurs with working/movement or could be associated with her BV. No associated vaginal bleeding, sharp pains or concern for SAB or ectopic at this time. She had similar symptoms with her last pregnancy. Patient appears comfortable on exam and has a benign abdominal exam. Suspect her nausea/emesis is mild in nature and discussed OTC medication appropriate and increased hydration as well as small frequent meals. No concern for dehydration on exam, and adequate PO intake, will not collect labs or administer IVF at  this time. Encouraged to initiate PNV as well. Patient encouraged to schedule IPV. Discussed strict return precautions, amendable to plan. Patient requesting work note, as her primary indication for today's visit.    Rozann Lesches, MD OB Fellow, Faculty [facility], Center for [facility] 10/05/2019 10:03 AM

## 2019-10-05 NOTE — MAU Note (Signed)
Pt presents to MAU with c/o intermittent lower abdominal pain x 5 days. She has also had nausea and vomiting that started last night, had two episodes in the last 24 hours. LMP-08/03/2019

## 2019-10-06 LAB — GC/CHLAMYDIA PROBE AMP (~~LOC~~) NOT AT ARMC
Chlamydia: NEGATIVE
Comment: NEGATIVE
Comment: NORMAL
Neisseria Gonorrhea: NEGATIVE

## 2019-11-08 ENCOUNTER — Encounter (HOSPITAL_COMMUNITY): Payer: Self-pay

## 2019-11-08 NOTE — MAU Note (Signed)
Pt chart entered on 11/08/19 at 1002. Pt requesting pregnancy confirmation letter. Letter printed for patient.

## 2019-11-11 ENCOUNTER — Encounter (HOSPITAL_COMMUNITY): Payer: Self-pay | Admitting: Obstetrics & Gynecology

## 2019-11-11 ENCOUNTER — Inpatient Hospital Stay (HOSPITAL_COMMUNITY): Payer: Medicaid Other

## 2019-11-11 ENCOUNTER — Inpatient Hospital Stay (HOSPITAL_COMMUNITY)
Admission: AD | Admit: 2019-11-11 | Discharge: 2019-11-11 | Disposition: A | Discharge status: Home / Self Care | Payer: Medicaid Other | Attending: Obstetrics & Gynecology | Admitting: Obstetrics & Gynecology

## 2019-11-11 ENCOUNTER — Other Ambulatory Visit: Payer: Self-pay

## 2019-11-11 DIAGNOSIS — O368390 Maternal care for abnormalities of the fetal heart rate or rhythm, unspecified trimester, not applicable or unspecified: Secondary | ICD-10-CM

## 2019-11-11 DIAGNOSIS — O99322 Drug use complicating pregnancy, second trimester: Secondary | ICD-10-CM | POA: Insufficient documentation

## 2019-11-11 DIAGNOSIS — F1290 Cannabis use, unspecified, uncomplicated: Secondary | ICD-10-CM | POA: Insufficient documentation

## 2019-11-11 DIAGNOSIS — Z87891 Personal history of nicotine dependence: Secondary | ICD-10-CM | POA: Diagnosis not present

## 2019-11-11 DIAGNOSIS — Z3A14 14 weeks gestation of pregnancy: Secondary | ICD-10-CM | POA: Insufficient documentation

## 2019-11-11 DIAGNOSIS — O26892 Other specified pregnancy related conditions, second trimester: Secondary | ICD-10-CM | POA: Insufficient documentation

## 2019-11-11 DIAGNOSIS — O26852 Spotting complicating pregnancy, second trimester: Secondary | ICD-10-CM | POA: Diagnosis present

## 2019-11-11 DIAGNOSIS — O368320 Maternal care for abnormalities of the fetal heart rate or rhythm, second trimester, not applicable or unspecified: Secondary | ICD-10-CM | POA: Insufficient documentation

## 2019-11-11 DIAGNOSIS — O021 Missed abortion: Secondary | ICD-10-CM | POA: Insufficient documentation

## 2019-11-11 DIAGNOSIS — R109 Unspecified abdominal pain: Secondary | ICD-10-CM | POA: Diagnosis not present

## 2019-11-11 LAB — WET PREP, GENITAL
Sperm: NONE SEEN
Trich, Wet Prep: NONE SEEN
Yeast Wet Prep HPF POC: NONE SEEN

## 2019-11-11 LAB — URINALYSIS, ROUTINE W REFLEX MICROSCOPIC
Bilirubin Urine: NEGATIVE
Glucose, UA: NEGATIVE mg/dL
Ketones, ur: NEGATIVE mg/dL
Nitrite: NEGATIVE
Protein, ur: NEGATIVE mg/dL
Specific Gravity, Urine: 1.019 (ref 1.005–1.030)
pH: 7 (ref 5.0–8.0)

## 2019-11-11 MED ORDER — ACETAMINOPHEN 500 MG PO TABS: 1000.0000 mg | ORAL_TABLET | Freq: Once | ORAL | Status: DC

## 2019-11-11 NOTE — MAU Provider Note (Signed)
History     CSN: [id number]  Arrival date and time: 11/11/19 3785   First Provider Initiated Contact with Patient 11/11/19 1016      Chief Complaint  Patient presents with  . Vaginal Bleeding   Allison Robles is a 24 y.o. G2P1001 at [redacted]w[redacted]d who receives care at [facility].  She presents today for Vaginal Bleeding.  Upon provider entrance into room, patient tearful and reports nurse was unable to hear FHT.  BSUS immediately obtained and SIUP at ~12 weeks identified, but no distinguishable HR.  Patient reports she started having cramping yesterday throughout the day. She states she did not take any medication.  She states around 1am she went to the bathroom she noticed dried blood and upon wiping she noticed spotting.  She states this morning she also had some bleeding with wiping and had copious amounts. She states she has having white clear odorless discharge prior to the bleeding. She endorses continued cramping at a 5/10.     OB History    Gravida  2   Para  1   Term  1   Preterm  0   AB  0   Living  1     SAB  0   TAB  0   Ectopic  0   Multiple  0   Live Births  1           Past Medical History:  Diagnosis Date  . Anemia   . Bacterial meningitis    as newborn, has hearing loss in left ear  . Medical history non-contributory     Past Surgical History:  Procedure Laterality Date  . NO PAST SURGERIES      Family History  Problem Relation Age of Onset  . Hypertension Mother     Social History   Tobacco Use  . Smoking status: Former Smoker    Types: Cigars    Quit date: 09/26/2018    Years since quitting: 1.1  . Smokeless tobacco: Never Used  . Tobacco comment: Pt smokes 3 black and milds a day  Vaping Use  . Vaping Use: Never used  Substance Use Topics  . Alcohol use: No  . Drug use: Yes    Types: Marijuana    Comment: Last use September 2021    Allergies: No Known Allergies  Medications Prior to Admission  Medication Sig Dispense  Refill Last Dose  . Blood Pressure Monitoring DEVI 1 Device by Does not apply route once a week. 1 Device 0   . Prenatal Vit-Fe Fumarate-FA (PRENATAL VITAMIN) 27-0.8 MG TABS Take 1 tablet by mouth daily. 30 tablet 1     Review of Systems  Respiratory: Negative for cough and shortness of breath.   Gastrointestinal: Positive for abdominal pain (Cramping). Negative for constipation, diarrhea, nausea and vomiting.  Genitourinary: Positive for vaginal bleeding. Negative for difficulty urinating, dysuria and pelvic pain.  Neurological: Positive for headaches. Negative for dizziness and light-headedness.   Physical Exam   Blood pressure 107/60, pulse (!) 55, temperature 98.2 F (36.8 C), temperature source Oral, resp. rate 20, height 5\' 6"  (1.676 m), weight 53.3 kg, last menstrual period 08/03/2019, SpO2 100 %, unknown if currently breastfeeding.  Physical Exam Vitals and nursing note reviewed. Exam conducted with a chaperone present.  Constitutional:      Appearance: Normal appearance.  HENT:     Head: Normocephalic and atraumatic.  Eyes:     Conjunctiva/sclera: Conjunctivae normal.  Cardiovascular:     Rate and  Rhythm: Normal rate and regular rhythm.     Heart sounds: Normal heart sounds.  Pulmonary:     Effort: Pulmonary effort is normal. No respiratory distress.     Breath sounds: Normal breath sounds.  Abdominal:     General: Bowel sounds are normal.     Tenderness: There is no abdominal tenderness.  Genitourinary:    Vagina: Vaginal discharge present. No tenderness or bleeding.     Cervix: No cervical motion tenderness or friability.     Uterus: Enlarged.      Comments: Speculum Exam: -Normal External Genitalia: Non tender, no apparent discharge at introitus.  -Vaginal Vault: Pink mucosa with good rugae. Scant amt frothy pink discharge -wet prep collected -Cervix:Pink, no lesions, cysts, or polyps.  Appears closed. No active bleeding from os-GC/CT collected -Bimanual Exam:   Uterine size difficult to assess d/t position.  No apparent tenderness.   Musculoskeletal:        General: Normal range of motion.     Cervical back: Normal range of motion.  Skin:    General: Skin is warm and dry.  Neurological:     Mental Status: She is alert and oriented to person, place, and time.  Psychiatric:        Mood and Affect: Mood normal.        Behavior: Behavior normal.        Thought Content: Thought content normal.     MAU Course  Procedures Results for orders placed or performed during the hospital encounter of 11/11/19 (from the past 24 hour(s))  Urinalysis, Routine w reflex microscopic Urine, Clean Catch     Status: Abnormal   Collection Time: 11/11/19  9:41 AM  Result Value Ref Range   Color, Urine YELLOW YELLOW   APPearance CLOUDY (A) CLEAR   Specific Gravity, Urine 1.019 1.005 - 1.030   pH 7.0 5.0 - 8.0   Glucose, UA NEGATIVE NEGATIVE mg/dL   Hgb urine dipstick MODERATE (A) NEGATIVE   Bilirubin Urine NEGATIVE NEGATIVE   Ketones, ur NEGATIVE NEGATIVE mg/dL   Protein, ur NEGATIVE NEGATIVE mg/dL   Nitrite NEGATIVE NEGATIVE   Leukocytes,Ua SMALL (A) NEGATIVE   RBC / HPF 0-5 0 - 5 RBC/hpf   WBC, UA 0-5 0 - 5 WBC/hpf   Bacteria, UA RARE (A) NONE SEEN   Squamous Epithelial / LPF 6-10 0 - 5   Mucus PRESENT    Amorphous Crystal PRESENT   Wet prep, genital     Status: Abnormal   Collection Time: 11/11/19 10:29 AM  Result Value Ref Range   Yeast Wet Prep HPF POC NONE SEEN NONE SEEN   Trich, Wet Prep NONE SEEN NONE SEEN   Clue Cells Wet Prep HPF POC PRESENT (A) NONE SEEN   WBC, Wet Prep HPF POC MODERATE (A) NONE SEEN   Sperm NONE SEEN    US OB Comp Less 14 Wks  Result Date: 11/11/2019 CLINICAL DATA:  No fetal heart tones. Positive urine pregnancy test on 10/05/2019 in a patient with last menstrual period reported at 08/03/2019, 14 weeks 2 days by last menstrual period. EXAM: OBSTETRIC <14 WK ULTRASOUND TECHNIQUE: Transabdominal ultrasound was performed for  evaluation of the gestation as well as the maternal uterus and adnexal regions. COMPARISON:  Prior obstetric ultrasounds, no recent comparison is for this just station FINDINGS: Intrauterine gestational sac: Single Yolk sac:  No Embryo:  Yes Cardiac Activity: No Heart Rate: 0 bpm CRL: 45.1 mm   11 w 2 d  [place] EDC: 05/30/2020 Subchorionic hemorrhage:  Small Maternal uterus/adnexae: No free fluid. LEFT ovary measures 3.6 x 1.7 x 2.2 cm. RIGHT ovary measures 3.7 x 1.8 x 2.5 cm. IMPRESSION: 1. Crown-rump length of 45.1 mm without fetal heart tones. Constellation of findings compatible with fetal demise. Electronically Signed   By: Donzetta Kohut M.D.   On: 11/11/2019 11:33    MDM Pelvic exam with  Wet Prep, GC/CT Labs: UA BSUS Formal US Consult Assessment and Plan  24 year old G2P1001 at 14.2 weeks Vaginal Spotting No FHT  -BSUS performed and no FHT noted. -Patient informed of findings and that provider will definitively confirm with demise with formal US. -Exam performed and findings discussed. -Cultures collected and pending.  -Patient offered and accepts pain medication. Will give tylenol. -Will send for [place] and await results.   Cherre Robins 11/11/2019, 10:16 AM   Reassessment (12:24 PM) Fetal Demise  -[place] results return with definitive demise at 11.2 weeks -Dr. Marcie Bal consulted regarding patient status and questions of appropriateness of cytotec usage for GA and CRL. States cytotec not recommended and patient needs to be scheduled for surgical removal. -Provider to bedside to discuss results. -Condolences given. -Patient and FOB requests time to grieve.  -Encouraged to take all the time needed and hit call light when ready.   Reassessment (12:56 PM)  -Patient requests provider back to bedside. -Discussed recommendation for surgical removal. -Patient agreeable. -Informed that surgery scheduler will contact within next few business days. -Bleeding Precautions  Given. -Patient without questions or concerns. -Message sent to J. Battle for scheduling of surgery as appropriate. -Encouraged to call or return to MAU if symptoms worsen or with the onset of new symptoms. -Discharged to home in stable condition.  Cherre Robins MSN, CNM Advanced Practice Provider, Center for [employer]

## 2019-11-11 NOTE — MAU Note (Signed)
Presents with c/o VB that began this morning @ 0100, now spotting with wiping.

## 2019-11-11 NOTE — Discharge Instructions (Signed)
Dilation and Curettage or Vacuum Curettage ° °Dilation and curettage (D&C) and vacuum curettage are minor procedures. A D&C involves stretching (dilation) the cervix and scraping (curettage) the inside lining of the uterus (endometrium). During a D&C, tissue is gently scraped from the endometrium, starting from the top portion of the uterus down to the lowest part of the uterus (cervix). During a vacuum curettage, the lining and tissue in the uterus are removed with the use of gentle suction. °Curettage may be performed to either diagnose or treat a problem. As a diagnostic procedure, curettage is performed to examine tissues from the uterus. A diagnostic curettage may be done if you have: °· Irregular bleeding in the uterus. °· Bleeding with the development of clots. °· Spotting between menstrual periods. °· Prolonged menstrual periods or other abnormal bleeding. °· Bleeding after menopause. °· No menstrual period (amenorrhea). °· A change in size and shape of the uterus. °· Abnormal endometrial cells discovered during a Pap test. °As a treatment procedure, curettage may be performed for the following reasons: °· Removal of an IUD (intrauterine device). °· Removal of retained placenta after giving birth. °· Abortion. °· Miscarriage. °· Removal of endometrial polyps. °· Removal of uncommon types of noncancerous lumps (fibroids). °Tell a health care provider about: °· Any allergies you have, including allergies to prescribed medicine or latex. °· All medicines you are taking, including vitamins, herbs, eye drops, creams, and over-the-counter medicines. This is especially important if you take any blood-thinning medicine. Bring a list of all of your medicines to your appointment. °· Any problems you or family members have had with anesthetic medicines. °· Any blood disorders you have. °· Any surgeries you have had. °· Your medical history and any medical conditions you have. °· Whether you are pregnant or may be  pregnant. °· Recent vaginal infections you have had. °· Recent menstrual periods, bleeding problems you have had, and what form of birth control (contraception) you use. °What are the risks? °Generally, this is a safe procedure. However, problems may occur, including: °· Infection. °· Heavy vaginal bleeding. °· Allergic reactions to medicines. °· Damage to the cervix or other structures or organs. °· Development of scar tissue (adhesions) inside the uterus, which can cause abnormal amounts of menstrual bleeding. This may make it harder to get pregnant in the future. °· A hole (perforation) or puncture in the uterine wall. This is rare. °What happens before the procedure? °Staying hydrated °Follow instructions from your health care provider about hydration, which may include: °· Up to 2 hours before the procedure - you may continue to drink clear liquids, such as water, clear fruit juice, black coffee, and plain tea. °Eating and drinking restrictions °Follow instructions from your health care provider about eating and drinking, which may include: °· 8 hours before the procedure - stop eating heavy meals or foods such as meat, fried foods, or fatty foods. °· 6 hours before the procedure - stop eating light meals or foods, such as toast or cereal. °· 6 hours before the procedure - stop drinking milk or drinks that contain milk. °· 2 hours before the procedure - stop drinking clear liquids. If your health care provider told you to take your medicine(s) on the day of your procedure, take them with only a sip of water. °Medicines °· Ask your health care provider about: °? Changing or stopping your regular medicines. This is especially important if you are taking diabetes medicines or blood thinners. °? Taking medicines such as aspirin   and ibuprofen. These medicines can thin your blood. Do not take these medicines before your procedure if your health care provider instructs you not to.  You may be given antibiotic  medicine to help prevent infection. General instructions  For 24 hours before your procedure, do not: ? Douche. ? Use tampons. ? Use medicines, creams, or suppositories in the vagina. ? Have sexual intercourse.  You may be given a pregnancy test on the day of the procedure.  Plan to have someone take you home from the hospital or clinic.  You may have a blood or urine sample taken.  If you will be going home right after the procedure, plan to have someone with you for 24 hours. What happens during the procedure?  To reduce your risk of infection: ? Your health care team will wash or sanitize their hands. ? Your skin will be washed with soap.  An IV tube will be inserted into one of your veins.  You will be given one of the following: ? A medicine that numbs the area in and around the cervix (local anesthetic). ? A medicine to make you fall asleep (general anesthetic).  You will lie down on your back, with your feet in foot rests (stirrups).  The size and position of your uterus will be checked.  A lubricated instrument (speculum or Sims retractor) will be inserted into the back side of your vagina. The speculum will be used to hold apart the walls of your vagina so your health care provider can see your cervix.  A tool (tenaculum) will be attached to the lip of the cervix to stabilize it.  Your cervix will be softened and dilated. This may be done by: ? Taking a medicine. ? Having tapered dilators or thin rods (laminaria) or gradual widening instruments (tapered dilators) inserted into your cervix.  A small, sharp, curved instrument (curette) will be used to scrape a small amount of tissue or cells from the endometrium or cervical canal. In some cases, gentle suction is applied with the curette. The curette will then be removed. The cells will be taken to a lab for testing. The procedure may vary among health care providers and hospitals. What happens after the  procedure?  You may have mild cramping, backache, pain, and light bleeding or spotting. You may pass small blood clots from your vagina.  You may have to wear compression stockings. These stockings help to prevent blood clots and reduce swelling in your legs.  Your blood pressure, heart rate, breathing rate, and blood oxygen level will be monitored until the medicines you were given have worn off. Summary  Dilation and curettage (D&C) involves stretching (dilation) the cervix and scraping (curettage) the inside lining of the uterus (endometrium).  After the procedure, you may have mild cramping, backache, pain, and light bleeding or spotting. You may pass small blood clots from your vagina.  Plan to have someone take you home from the hospital or clinic. This information is not intended to replace advice given to you by your health care provider. Make sure you discuss any questions you have with your health care provider. Document Revised: 01/23/2017 Document Reviewed: 10/28/2015 Elsevier Patient Education  2020 [employer]. Fetal Demise Fetal demise, also called intrauterine fetal demise (IUFD) or fetal death, is the loss of a baby after 20 weeks of pregnancy. When this occurs, the unborn baby (fetus) does not show any signs of life, such as a heartbeat or breathing. Most women recognize the loss  of the baby soon after it happens, due to the lack of fetal movement. Some women experience spontaneous labor shortly after fetal demise resulting in a stillborn birth (stillbirth). What are the causes? The cause is often unknown. Sometimes fetal demise may be caused by:  A problem with the umbilical cord or placenta.  A birth defect or genetic disorder in the baby.  A serious infection during pregnancy.  The baby not growing enough in the womb (poor fetal growth).  Long-term health problems in the mother, such as diabetes, lupus, kidney disease, thyroid disease, or gallbladder  disease.  High blood pressure (hypertension) or a related disorder such as preeclampsia. What increases the risk? You may have a higher risk of fetal demise if you:  Use tobacco, drugs, or alcohol during pregnancy.  Are pregnant with two or more babies.  Are older than age 24.  Are of African-American descent.  Are pregnant for the first time.  Are obese. What are the signs or symptoms? When you experience fetal demise, your abdomen will stop getting bigger and you will not feel your baby move. Fetal demise increases your risk of certain complications, such as:  Severe bleeding due to problems with blood clotting (disseminated intravascular coagulation).  Infection in the uterus (intrauterine infection).  Increased bleeding from the uterus. How is this diagnosed? A physical exam may show signs of fetal demise. The diagnosis must be confirmed with an ultrasound to check for:  The lack of a heartbeat.  The lack of fetal movement. How is this treated? Treatment for fetal demise may include:  Waiting for your body's natural process of labor to begin (expectant management). You will be monitored closely during this time for signs of infection or other problems. You may have the option to return home with instructions about what to expect as your body continues to naturally release your baby and placenta. If labor does not start on its own, you will need a labor induction.  Using medicines to start your labor (labor induction). If labor has not started, you may choose to have your labor induced with medicines and be monitored in the hospital setting. This will cause you to have contractions in order to deliver your baby and placenta.  Doing surgery to deliver your baby and placenta through an incision in your abdomen and your uterus (cesarean delivery, or C-section). After delivery:  Parents are encouraged to see and hold their baby. This may help you with the grieving  process.  You may consider creating keepsakes, such as taking a photo or getting an imprint of your baby's footprint or handprint.  Request to have any religious or cultural ceremonies that you prefer. Work with your health care team to make arrangements for the handling of your baby's remains.  Your baby may be examined to determine if problems were present that could happen again in a future pregnancy.  You may be given antibiotic medicine to treat infection if you lost your baby because of an infection.  If you have Rh-negative blood, you may be given a injection of Rho(D) immune globulin to help prevent problems with future pregnancies. Follow these instructions at home: Medicines  Take over-the-counter and prescription medicines only as told by your health care provider.  If you were prescribed an antibiotic, take it as told by your health care provider. Do not stop taking the antibiotic even if you start to feel better. General instructions  Your health care provider may recommend working with a trained  grief counselor who can help you work through your emotions. It may help to work with this counselor before and after delivery.  It may be helpful to meet with others who have experienced fetal demise. Ask your health care provider about support groups and resources.  Talk with your health care provider about any activity restrictions, including sexual activity.  Use a sanitary napkin for bleeding and discharge from your vagina. Do not douche or use tampons until your health care provider says that this is safe.  Keep all follow-up visits as told by your health care provider. This is important.  When you are ready, meet with your health care provider to discuss steps to take for a future pregnancy. Contact a health care provider if you:  Continue to experience grief, sadness, or lack of motivation for everyday activities, and those feelings do not improve over time.  Have  painful, hard, or reddened breasts.  Have not had a menstrual period by the 12th week after delivery. Get help right away if you have:  Heavy vaginal bleeding that soaks more than 1 regular sanitary pad in an hour.  Chest pain.  A fever or chills.  Shortness of breath.  Vaginal bleeding that continues for more than 6 weeks after delivery.  Pain while urinating.  Blood in your urine.  Bad-smelling vaginal discharge.  Abdominal pain.  Leg pain, swelling, or redness.  A severe headache.  Changes in your vision.  Feelings of sadness that take over your thoughts, or you have thoughts of hurting yourself. If you ever feel like you may hurt yourself or others, or have thoughts about taking your own life, get help right away. You can go to your nearest emergency department or call:  Your local emergency services (911 in the [place]  A suicide crisis helpline, such as the National Suicide Prevention Lifeline at 250-289-7613. This is open 24 hours a day. Summary  Fetal demise, also called intrauterine fetal demise (IUFD) or fetal death, is the loss of a baby after 20 weeks of pregnancy.  You may be at higher risk for fetal demise if you use tobacco, drugs, or alcohol during pregnancy.  Treatment may include waiting for your body's natural process of labor to begin, using medicine to start labor (labor induction), or doing surgery to deliver your baby (cesarean delivery, or C-section).  Your health care provider may recommend working with a trained grief counselor who can help you work through your emotions. This information is not intended to replace advice given to you by your health care provider. Make sure you discuss any questions you have with your health care provider. Document Revised: 01/23/2017 Document Reviewed: 12/23/2016 Elsevier Patient Education  2020 [employer].

## 2019-11-14 ENCOUNTER — Other Ambulatory Visit (HOSPITAL_COMMUNITY)
Admission: RE | Admit: 2019-11-14 | Discharge: 2019-11-14 | Disposition: A | Discharge status: Home / Self Care | Payer: Medicaid Other | Source: Ambulatory Visit | Attending: Obstetrics and Gynecology | Admitting: Obstetrics and Gynecology

## 2019-11-14 DIAGNOSIS — Z20822 Contact with and (suspected) exposure to covid-19: Secondary | ICD-10-CM | POA: Diagnosis not present

## 2019-11-14 DIAGNOSIS — Z01812 Encounter for preprocedural laboratory examination: Secondary | ICD-10-CM | POA: Insufficient documentation

## 2019-11-14 LAB — GC/CHLAMYDIA PROBE AMP (~~LOC~~) NOT AT ARMC
Chlamydia: NEGATIVE
Comment: NEGATIVE
Comment: NORMAL
Neisseria Gonorrhea: NEGATIVE

## 2019-11-14 LAB — SARS CORONAVIRUS 2 (TAT 6-24 HRS): SARS Coronavirus 2: NEGATIVE

## 2019-11-15 ENCOUNTER — Other Ambulatory Visit: Payer: Self-pay

## 2019-11-15 ENCOUNTER — Inpatient Hospital Stay (HOSPITAL_COMMUNITY): Payer: Medicaid Other

## 2019-11-15 ENCOUNTER — Other Ambulatory Visit: Payer: Self-pay | Admitting: Obstetrics and Gynecology

## 2019-11-15 ENCOUNTER — Inpatient Hospital Stay (HOSPITAL_COMMUNITY)
Admission: AD | Admit: 2019-11-15 | Discharge: 2019-11-15 | Disposition: A | Discharge status: Home / Self Care | Payer: Medicaid Other | Attending: Obstetrics & Gynecology | Admitting: Obstetrics & Gynecology

## 2019-11-15 DIAGNOSIS — R109 Unspecified abdominal pain: Secondary | ICD-10-CM | POA: Insufficient documentation

## 2019-11-15 DIAGNOSIS — O99011 Anemia complicating pregnancy, first trimester: Secondary | ICD-10-CM | POA: Diagnosis not present

## 2019-11-15 DIAGNOSIS — Z3A11 11 weeks gestation of pregnancy: Secondary | ICD-10-CM | POA: Insufficient documentation

## 2019-11-15 DIAGNOSIS — O021 Missed abortion: Secondary | ICD-10-CM

## 2019-11-15 DIAGNOSIS — D649 Anemia, unspecified: Secondary | ICD-10-CM | POA: Diagnosis not present

## 2019-11-15 DIAGNOSIS — O039 Complete or unspecified spontaneous abortion without complication: Secondary | ICD-10-CM | POA: Insufficient documentation

## 2019-11-15 DIAGNOSIS — O99321 Drug use complicating pregnancy, first trimester: Secondary | ICD-10-CM | POA: Insufficient documentation

## 2019-11-15 DIAGNOSIS — F1290 Cannabis use, unspecified, uncomplicated: Secondary | ICD-10-CM | POA: Insufficient documentation

## 2019-11-15 DIAGNOSIS — O26891 Other specified pregnancy related conditions, first trimester: Secondary | ICD-10-CM | POA: Diagnosis not present

## 2019-11-15 DIAGNOSIS — Z6790 Unspecified blood type, Rh positive: Secondary | ICD-10-CM

## 2019-11-15 DIAGNOSIS — Z87891 Personal history of nicotine dependence: Secondary | ICD-10-CM | POA: Diagnosis not present

## 2019-11-15 LAB — CBC
HCT: 39.6 % (ref 36.0–46.0)
Hemoglobin: 12.4 g/dL (ref 12.0–15.0)
MCH: 25.2 pg — ABNORMAL LOW (ref 26.0–34.0)
MCHC: 31.3 g/dL (ref 30.0–36.0)
MCV: 80.5 fL (ref 80.0–100.0)
Platelets: 281 10*3/uL (ref 150–400)
RBC: 4.92 MIL/uL (ref 3.87–5.11)
RDW: 15.4 % (ref 11.5–15.5)
WBC: 12.5 10*3/uL — ABNORMAL HIGH (ref 4.0–10.5)
nRBC: 0 % (ref 0.0–0.2)

## 2019-11-15 LAB — TYPE AND SCREEN
ABO/RH(D): O POS
Antibody Screen: NEGATIVE

## 2019-11-15 MED ORDER — FENTANYL CITRATE (PF) 100 MCG/2ML IJ SOLN
50.0000 ug | Freq: Once | INTRAMUSCULAR | Status: AC
  Administered 2019-11-15: 50 ug via INTRAVENOUS
  Filled 2019-11-15: qty 2

## 2019-11-15 MED ORDER — MISOPROSTOL 200 MCG PO TABS
800.0000 ug | ORAL_TABLET | Freq: Once | ORAL | Status: AC
  Administered 2019-11-15: 800 ug via ORAL
  Filled 2019-11-15: qty 4

## 2019-11-15 MED ORDER — IBUPROFEN 600 MG PO TABS: 600.0000 mg | ORAL_TABLET | Freq: Four times a day (QID) | ORAL | 0 refills | Status: DC | PRN

## 2019-11-15 NOTE — MAU Note (Signed)
Pt reports she awakened at 0100 with sharp abd cramping. She reports she went to the restroom and thinks she passed the baby at the same time she had an episode of diarrhea. Pt was scheduled for d&e on Thursday due to u/s that showed fetal demise.

## 2019-11-15 NOTE — MAU Provider Note (Signed)
History     CSN: [id number]  Arrival date and time: 11/15/19 5027   First Provider Initiated Contact with Patient 11/15/19 0236      Chief Complaint  Patient presents with  . Vaginal Bleeding   24 y.o. G2P1001 with a known 11 wk IUFD presenting via EMS for VB and pain. Report onset around 1am. She thinks she passed the fetus. Reports large amt of bleeding since. C/o cramping LAP. Rates 8/10. Has not taken anything for it. She was scheduled for D&E tomorrow.   OB History    Gravida  2   Para  1   Term  1   Preterm  0   AB  0   Living  1     SAB  0   TAB  0   Ectopic  0   Multiple  0   Live Births  1           Past Medical History:  Diagnosis Date  . Anemia   . Bacterial meningitis    as newborn, has hearing loss in left ear  . Medical history non-contributory     Past Surgical History:  Procedure Laterality Date  . NO PAST SURGERIES      Family History  Problem Relation Age of Onset  . Hypertension Mother     Social History   Tobacco Use  . Smoking status: Former Smoker    Types: Cigars    Quit date: 09/26/2018    Years since quitting: 1.1  . Smokeless tobacco: Never Used  . Tobacco comment: Pt smokes 3 black and milds a day  Vaping Use  . Vaping Use: Never used  Substance Use Topics  . Alcohol use: No  . Drug use: Yes    Types: Marijuana    Comment: Last use September 2021    Allergies: No Known Allergies  Medications Prior to Admission  Medication Sig Dispense Refill Last Dose  . Blood Pressure Monitoring DEVI 1 Device by Does not apply route once a week. 1 Device 0   . Prenatal Vit-Fe Fumarate-FA (PRENATAL VITAMIN) 27-0.8 MG TABS Take 1 tablet by mouth daily. 30 tablet 1     Review of Systems  Gastrointestinal: Positive for abdominal pain.  Genitourinary: Positive for vaginal bleeding.  Neurological: Positive for light-headedness. Negative for syncope.   Physical Exam   Blood pressure (!) 109/50, pulse (!) 47, temperature  98.4 F (36.9 C), resp. rate 18, last menstrual period 08/03/2019, SpO2 100 %, unknown if currently breastfeeding.  Physical Exam Vitals and nursing note reviewed. Exam conducted with a chaperone present.  Constitutional:      General: She is not in acute distress.    Appearance: Normal appearance.  HENT:     Head: Normocephalic and atraumatic.  Cardiovascular:     Rate and Rhythm: Bradycardia present.  Pulmonary:     Effort: Pulmonary effort is normal. No respiratory distress.  Abdominal:     General: There is no distension.     Palpations: Abdomen is soft.     Tenderness: There is no abdominal tenderness. There is no guarding or rebound.  Genitourinary:    Comments: External: no lesions or erythema Vagina: rugated, pink, moist, mod amt of blood and small clots in vault Cervix 1cm  Musculoskeletal:        General: Normal range of motion.     Cervical back: Normal range of motion.  Skin:    General: Skin is warm and dry.  Neurological:  General: No focal deficit present.     Mental Status: She is alert and oriented to person, place, and time.  Psychiatric:        Mood and Affect: Mood normal.    Results for orders placed or performed during the hospital encounter of 11/15/19 (from the past 24 hour(s))  Type and screen     Status: None   Collection Time: 11/15/19  2:50 AM  Result Value Ref Range   ABO/RH(D) O POS    Antibody Screen NEG    Sample Expiration      11/18/2019,2359 Performed at [facility], [address] [place], [place] [address]   CBC     Status: Abnormal   Collection Time: 11/15/19  2:59 AM  Result Value Ref Range   WBC 12.5 (H) 4.0 - 10.5 K/uL   RBC 4.92 3.87 - 5.11 MIL/uL   Hemoglobin 12.4 12.0 - 15.0 g/dL   HCT 46.9 36 - 46 %   MCV 80.5 80.0 - 100.0 fL   MCH 25.2 (L) 26.0 - 34.0 pg   MCHC 31.3 30.0 - 36.0 g/dL   RDW 62.9 52.8 - 41.3 %   Platelets 281 150 - 400 K/uL   nRBC 0.0 0.0 - 0.2 %   US OB Transvaginal  Result Date:  11/15/2019 CLINICAL DATA:  11 week intrauterine fetal demise, vaginal bleeding EXAM: TRANSVAGINAL OB ULTRASOUND TECHNIQUE: Transvaginal ultrasound was performed for complete evaluation of the gestation as well as the maternal uterus, adnexal regions, and pelvic cul-de-sac. COMPARISON:  11/11/2019 FINDINGS: Intrauterine gestational sac: None Maternal uterus/adnexae: Previously noted intrauterine gestational sac has been evacuated. Small amount of avascular debris is seen within the endometrial cavity within the lower uterine segment. The endometrium appears thickened, measuring roughly 25 mm in thickness, in keeping with recent just station. No intrauterine masses are seen. The cervix is unremarkable. The maternal ovaries are normal in size and echogenicity. No adnexal masses are seen. There is no free fluid within the pelvis. IMPRESSION: Interval complete abortion. Small amount of debris within the endometrial cavity and thickening of the endometrium likely represents the residua of recent gestation. No vascularized retained products of conception. Electronically Signed   By: Helyn Numbers MD   On: 11/15/2019 05:27   MAU Course  Procedures Cytotec Fentanyl  MDM Labs and [place] ordered and reviewed. Complete SAB confirmed. Bleeding minimal. Pain improved. Stable for discharge home.   Assessment and Plan   1. Spontaneous abortion   2. Blood type, Rh positive    Discharge home Follow up at [facility] in 2 weeks- message sent Return precautions Pelvic rest Rx Ibuprofen  Allergies as of 11/15/2019   No Known Allergies     Medication List    TAKE these medications   Blood Pressure Monitoring Devi 1 Device by Does not apply route once a week.   ibuprofen 600 MG tablet Commonly known as: ADVIL Take 1 tablet (600 mg total) by mouth every 6 (six) hours as needed.   Prenatal Vitamin 27-0.8 MG Tabs Take 1 tablet by mouth daily.      Donette Larry, CNM 11/15/2019, 6:15 AM

## 2019-11-15 NOTE — Discharge Instructions (Signed)
Miscarriage A miscarriage is the loss of an unborn baby (fetus) before the 20th week of pregnancy. Follow these instructions at home: Medicines   Take over-the-counter and prescription medicines only as told by your doctor.  If you were prescribed antibiotic medicine, take it as told by your doctor. Do not stop taking the antibiotic even if you start to feel better.  Do not take NSAIDs unless your doctor says that this is safe for you. NSAIDs include aspirin and ibuprofen. These medicines can cause bleeding. Activity  Rest as directed. Ask your doctor what activities are safe for you.  Have someone help you at home during this time. General instructions  Write down how many pads you use each day and how soaked they are.  Watch the amount of tissue or clumps of blood (blood clots) that you pass from your vagina. Save any large amounts of tissue for your doctor.  Do not use tampons, douche, or have sex until your doctor approves.  To help you and your partner with the process of grieving, talk with your doctor or seek counseling.  When you are ready, meet with your doctor to talk about steps you should take for your health. Also, talk with your doctor about steps to take to have a healthy pregnancy in the future.  Keep all follow-up visits as told by your doctor. This is important. Contact a doctor if:  You have a fever or chills.  You have vaginal discharge that smells bad.  You have more bleeding. Get help right away if:  You have very bad cramps or pain in your back or belly.  You pass clumps of blood that are walnut-sized or larger from your vagina.  You pass tissue that is walnut-sized or larger from your vagina.  You soak more than 1 regular pad in an hour.  You get light-headed or weak.  You faint (pass out).  You have feelings of sadness that do not go away, or you have thoughts of hurting yourself. Summary  A miscarriage is the loss of an unborn baby before  the 20th week of pregnancy.  Follow your doctor's instructions for home care. Keep all follow-up appointments.  To help you and your partner with the process of grieving, talk with your doctor or seek counseling. This information is not intended to replace advice given to you by your health care provider. Make sure you discuss any questions you have with your health care provider. Document Revised: 06/04/2018 Document Reviewed: 03/18/2016 Elsevier Patient Education  2020 [employer]  

## 2019-11-16 ENCOUNTER — Encounter (HOSPITAL_BASED_OUTPATIENT_CLINIC_OR_DEPARTMENT_OTHER): Admission: RE | Payer: Self-pay | Source: Home / Self Care

## 2019-11-16 ENCOUNTER — Ambulatory Visit (HOSPITAL_BASED_OUTPATIENT_CLINIC_OR_DEPARTMENT_OTHER)
Admission: RE | Admit: 2019-11-16 | Payer: Medicaid Other | Source: Home / Self Care | Admitting: Obstetrics and Gynecology

## 2019-11-16 SURGERY — DILATION AND EVACUATION, UTERUS
Anesthesia: Choice

## 2019-11-22 ENCOUNTER — Encounter: Payer: Medicaid Other | Admitting: Nurse Practitioner

## 2019-12-04 ENCOUNTER — Emergency Department (HOSPITAL_COMMUNITY)
Admission: EM | Admit: 2019-12-04 | Discharge: 2019-12-05 | Disposition: A | Discharge status: Home / Self Care | Payer: Medicaid Other | Attending: Emergency Medicine | Admitting: Emergency Medicine

## 2019-12-04 ENCOUNTER — Encounter (HOSPITAL_COMMUNITY): Payer: Self-pay

## 2019-12-04 DIAGNOSIS — R251 Tremor, unspecified: Secondary | ICD-10-CM | POA: Insufficient documentation

## 2019-12-04 DIAGNOSIS — Z5321 Procedure and treatment not carried out due to patient leaving prior to being seen by health care provider: Secondary | ICD-10-CM | POA: Insufficient documentation

## 2019-12-04 NOTE — ED Triage Notes (Signed)
Pt presents with c/o "body shaking".  Onset 30 minutes PTA.  Pt states she was unable to go to work today d/t headache.  Had miscarriage 2 weeks ago and "has not been her self".  Sees therapist for PTSD.   Denies fever, cough, shortness of breath.

## 2019-12-05 NOTE — ED Notes (Signed)
Pt left due to wait time  

## 2019-12-08 ENCOUNTER — Ambulatory Visit: Payer: Medicaid Other | Admitting: Student

## 2019-12-26 ENCOUNTER — Ambulatory Visit: Payer: Medicaid Other

## 2019-12-26 ENCOUNTER — Encounter: Payer: Self-pay | Admitting: *Deleted

## 2019-12-26 NOTE — Progress Notes (Signed)
[facility] follow up sab appointment. Per discussion with provider would like appointment rescheduled. Will notify registar to reschedule.  Dioselina Brumbaugh,RN

## 2020-01-06 ENCOUNTER — Other Ambulatory Visit: Payer: Self-pay

## 2020-01-06 ENCOUNTER — Encounter: Payer: Self-pay | Admitting: Family Medicine

## 2020-01-06 ENCOUNTER — Ambulatory Visit (INDEPENDENT_AMBULATORY_CARE_PROVIDER_SITE_OTHER): Payer: Medicaid Other | Admitting: Family Medicine

## 2020-01-06 VITALS — BP 113/73 | HR 80 | Ht 67.0 in | Wt 114.7 lb

## 2020-01-06 DIAGNOSIS — O039 Complete or unspecified spontaneous abortion without complication: Secondary | ICD-10-CM | POA: Diagnosis not present

## 2020-01-06 DIAGNOSIS — G43709 Chronic migraine without aura, not intractable, without status migrainosus: Secondary | ICD-10-CM | POA: Insufficient documentation

## 2020-01-06 LAB — POCT PREGNANCY, URINE: Preg Test, Ur: NEGATIVE

## 2020-01-06 NOTE — Patient Instructions (Addendum)
Please also go to [email or web address] for more information on birth control   Contraception Choices Contraception, also called birth control, means things to use or ways to try not to get pregnant. Hormonal birth control This kind of birth control uses hormones. Here are some types of hormonal birth control:  A tube that is put under skin of the arm (implant). The tube can stay in for as long as 3 years.  Shots to get every 3 months (injections).  Pills to take every day (birth control pills).  A patch to change 1 time each week for 3 weeks (birth control patch). After that, the patch is taken off for 1 week.  A ring to put in the vagina. The ring is left in for 3 weeks. Then it is taken out of the vagina for 1 week. Then a new ring is put in.  Pills to take after unprotected sex (emergency birth control pills). Barrier birth control Here are some types of barrier birth control:  A thin covering that is put on the penis before sex (female condom). The covering is thrown away after sex.  A soft, loose covering that is put in the vagina before sex (female condom). The covering is thrown away after sex.  A rubber bowl that sits over the cervix (diaphragm). The bowl must be made for you. The bowl is put into the vagina before sex. The bowl is left in for 6-8 hours after sex. It is taken out within 24 hours.  A small, soft cup that fits over the cervix (cervical cap). The cup must be made for you. The cup can be left in for 6-8 hours after sex. It is taken out within 48 hours.  A sponge that is put into the vagina before sex. It must be left in for at least 6 hours after sex. It must be taken out within 30 hours. Then it is thrown away.  A chemical that kills or stops sperm from getting into the uterus (spermicide). It may be a pill, cream, jelly, or foam to put in the vagina. The chemical should be used at least 10-15 minutes before sex. IUD (intrauterine) birth control An IUD is a small,  T-shaped piece of plastic. It is put inside the uterus. There are two kinds:  Hormone IUD. This kind can stay in for 3-5 years.  Copper IUD. This kind can stay in for 10 years. Permanent birth control Here are some types of permanent birth control:  Surgery to block the fallopian tubes.  Having an insert put into each fallopian tube.  Surgery to tie off the tubes that carry sperm (vasectomy). Natural planning birth control Here are some types of natural planning birth control:  Not having sex on the days the woman could get pregnant.  Using a calendar: ? To keep track of the length of each period. ? To find out what days pregnancy can happen. ? To plan to not have sex on days when pregnancy can happen.  Watching for symptoms of ovulation and not having sex during ovulation. One way the woman can check for ovulation is to check her temperature.  Waiting to have sex until after ovulation. Summary  Contraception, also called birth control, means things to use or ways to try not to get pregnant.  Hormonal methods of birth control include implants, injections, pills, patches, vaginal rings, and emergency birth control pills.  Barrier methods of birth control can include female condoms, female condoms, diaphragms, cervical caps, sponges,  and spermicides.  There are two types of IUD (intrauterine device) birth control. An IUD can be put in a woman's uterus to prevent pregnancy for 3-5 years.  Permanent sterilization can be done through a procedure for males, females, or both.  Natural planning methods involve not having sex on the days when the woman could get pregnant. This information is not intended to replace advice given to you by your health care provider. Make sure you discuss any questions you have with your health care provider. Document Revised: 06/02/2018 Document Reviewed: 02/21/2016 Elsevier Patient Education  2020 [employer].

## 2020-01-06 NOTE — Assessment & Plan Note (Signed)
Discussed as we would not follow her condition will not order CT, also more likely to need MRI but do not think it is indicated given migraines are long standing. Suggested PCP vs Neurology, referral made for Neurology.

## 2020-01-06 NOTE — Assessment & Plan Note (Signed)
Doing well. Desires birth control, planning on Depo at [facility] as not covered by her insurance here. UPT negative today.

## 2020-01-06 NOTE — Progress Notes (Signed)
   GYNECOLOGY OFFICE VISIT NOTE  History:   Allison Robles is a 24 y.o. G2P1001 here today for for follow up of SAB.  Last seen in [facility] 11/15/2019 after SAB at 11 wks for VB Had TVUS at that time showing complete abortion with no residual POC's Periods back to normal Wants BC, thinking about depo  Would like to have a head CT for severe migraines Does not get auras Longstanding, not new Has tried advil and tylenol in the past, not helpful Gets them daily   Past Medical History:  Diagnosis Date  . Anemia   . Bacterial meningitis    as newborn, has hearing loss in left ear  . Medical history non-contributory     Past Surgical History:  Procedure Laterality Date  . NO PAST SURGERIES      The following portions of the patient's history were reviewed and updated as appropriate: allergies, current medications, past family history, past medical history, past social history, past surgical history and problem list.   Health Maintenance:  Normal pap and negative HRHPV on 12/20/2018.  Not due for mammogram.   Review of Systems:  Pertinent items noted in HPI and remainder of comprehensive ROS otherwise negative.  Physical Exam:  BP 113/73   Pulse 80   Ht 5\' 7"  (1.702 m)   Wt 114 lb 11.2 oz (52 kg)   LMP 08/03/2019 (Approximate)   BMI 17.96 kg/m  CONSTITUTIONAL: Well-developed, well-nourished female in no acute distress.  HEENT:  Normocephalic, atraumatic. External right and left ear normal. No scleral icterus.  NECK: Normal range of motion, supple, no masses noted on observation SKIN: No rash noted. Not diaphoretic. No erythema. No pallor. MUSCULOSKELETAL: Normal range of motion. No edema noted. NEUROLOGIC: Alert and oriented to person, place, and time. Normal muscle tone coordination.  PSYCHIATRIC: Normal mood and affect. Normal behavior. Normal judgment and thought content. RESPIRATORY: Effort normal, no problems with respiration noted  PELVIC: Deferred  Labs and  Imaging Results for orders placed or performed in visit on 01/06/20 (from the past 168 hour(s))  Pregnancy, urine POC   Collection Time: 01/06/20 10:07 AM  Result Value Ref Range   Preg Test, Ur NEGATIVE NEGATIVE   No results found.    Assessment and Plan:   Problem List Items Addressed This Visit      Cardiovascular and Mediastinum   Chronic migraine without aura without status migrainosus, not intractable    Discussed as we would not follow her condition will not order CT, also more likely to need MRI but do not think it is indicated given migraines are long standing. Suggested PCP vs Neurology, referral made for Neurology.       Relevant Orders   Ambulatory referral to Neurology     Other   SAB (spontaneous abortion) - Primary    Doing well. Desires birth control, planning on Depo at [facility] as not covered by her insurance here. UPT negative today.          Routine preventative health maintenance measures emphasized. Please refer to After Visit Summary for other counseling recommendations.   Return if symptoms worsen or fail to improve.    Total face-to-face time with patient: 20 minutes.  Over 50% of encounter was spent on counseling and coordination of care.   [facility], MD/[facility], [facility]

## 2020-01-13 ENCOUNTER — Emergency Department (HOSPITAL_COMMUNITY)
Admission: EM | Admit: 2020-01-13 | Discharge: 2020-01-14 | Disposition: A | Discharge status: Home / Self Care | Payer: Medicaid Other | Attending: Emergency Medicine | Admitting: Emergency Medicine

## 2020-01-13 ENCOUNTER — Other Ambulatory Visit: Payer: Self-pay

## 2020-01-13 DIAGNOSIS — Z87891 Personal history of nicotine dependence: Secondary | ICD-10-CM | POA: Insufficient documentation

## 2020-01-13 DIAGNOSIS — R112 Nausea with vomiting, unspecified: Secondary | ICD-10-CM | POA: Insufficient documentation

## 2020-01-13 DIAGNOSIS — R1084 Generalized abdominal pain: Secondary | ICD-10-CM | POA: Insufficient documentation

## 2020-01-13 DIAGNOSIS — R197 Diarrhea, unspecified: Secondary | ICD-10-CM | POA: Insufficient documentation

## 2020-01-13 DIAGNOSIS — F1590 Other stimulant use, unspecified, uncomplicated: Secondary | ICD-10-CM | POA: Diagnosis not present

## 2020-01-13 DIAGNOSIS — Z5321 Procedure and treatment not carried out due to patient leaving prior to being seen by health care provider: Secondary | ICD-10-CM | POA: Insufficient documentation

## 2020-01-13 LAB — CBC
HCT: 44.9 % (ref 36.0–46.0)
Hemoglobin: 14 g/dL (ref 12.0–15.0)
MCH: 25.8 pg — ABNORMAL LOW (ref 26.0–34.0)
MCHC: 31.2 g/dL (ref 30.0–36.0)
MCV: 82.8 fL (ref 80.0–100.0)
Platelets: 369 10*3/uL (ref 150–400)
RBC: 5.42 MIL/uL — ABNORMAL HIGH (ref 3.87–5.11)
RDW: 13.9 % (ref 11.5–15.5)
WBC: 7.8 10*3/uL (ref 4.0–10.5)
nRBC: 0 % (ref 0.0–0.2)

## 2020-01-13 LAB — URINALYSIS, ROUTINE W REFLEX MICROSCOPIC
Bacteria, UA: NONE SEEN
Bilirubin Urine: NEGATIVE
Glucose, UA: NEGATIVE mg/dL
Hgb urine dipstick: NEGATIVE
Ketones, ur: 5 mg/dL — AB
Leukocytes,Ua: NEGATIVE
Nitrite: NEGATIVE
Protein, ur: 30 mg/dL — AB
Specific Gravity, Urine: 1.032 — ABNORMAL HIGH (ref 1.005–1.030)
pH: 5 (ref 5.0–8.0)

## 2020-01-13 LAB — COMPREHENSIVE METABOLIC PANEL
ALT: 10 U/L (ref 0–44)
AST: 15 U/L (ref 15–41)
Albumin: 3.8 g/dL (ref 3.5–5.0)
Alkaline Phosphatase: 84 U/L (ref 38–126)
Anion gap: 11 (ref 5–15)
BUN: 10 mg/dL (ref 6–20)
CO2: 23 mmol/L (ref 22–32)
Calcium: 9.1 mg/dL (ref 8.9–10.3)
Chloride: 103 mmol/L (ref 98–111)
Creatinine, Ser: 0.87 mg/dL (ref 0.44–1.00)
GFR, Estimated: >60 mL/min (ref >60)
Glucose, Bld: 102 mg/dL — ABNORMAL HIGH (ref 70–99)
Potassium: 3.6 mmol/L (ref 3.5–5.1)
Sodium: 137 mmol/L (ref 135–145)
Total Bilirubin: 1.2 mg/dL (ref 0.3–1.2)
Total Protein: 7.6 g/dL (ref 6.5–8.1)

## 2020-01-13 LAB — LIPASE, BLOOD: Lipase: 25 U/L (ref 11–51)

## 2020-01-13 LAB — I-STAT BETA HCG BLOOD, ED (MC, WL, AP ONLY): I-stat hCG, quantitative: <5 m[IU]/mL (ref <5)

## 2020-01-13 MED ORDER — ONDANSETRON 4 MG PO TBDP
4.0000 mg | ORAL_TABLET | Freq: Once | ORAL | Status: AC | PRN
  Administered 2020-01-13: 4 mg via ORAL

## 2020-01-13 NOTE — ED Notes (Signed)
No answer for VS after multiple calls

## 2020-01-13 NOTE — ED Triage Notes (Signed)
Pt presents to ED POV. Pt c/o generalized abd pain that began yesterday. Pt also c/o n/v/d. emsesis x4 today.

## 2020-01-14 ENCOUNTER — Emergency Department (HOSPITAL_COMMUNITY)
Admission: EM | Admit: 2020-01-14 | Discharge: 2020-01-14 | Disposition: A | Discharge status: Home / Self Care | Payer: Medicaid Other | Source: Home / Self Care | Attending: Emergency Medicine | Admitting: Emergency Medicine

## 2020-01-14 ENCOUNTER — Encounter (HOSPITAL_COMMUNITY): Payer: Self-pay | Admitting: *Deleted

## 2020-01-14 ENCOUNTER — Emergency Department (HOSPITAL_COMMUNITY): Payer: Medicaid Other

## 2020-01-14 ENCOUNTER — Other Ambulatory Visit: Payer: Self-pay

## 2020-01-14 DIAGNOSIS — R1084 Generalized abdominal pain: Secondary | ICD-10-CM | POA: Insufficient documentation

## 2020-01-14 DIAGNOSIS — R110 Nausea: Secondary | ICD-10-CM | POA: Insufficient documentation

## 2020-01-14 DIAGNOSIS — Z87891 Personal history of nicotine dependence: Secondary | ICD-10-CM | POA: Insufficient documentation

## 2020-01-14 DIAGNOSIS — R630 Anorexia: Secondary | ICD-10-CM | POA: Insufficient documentation

## 2020-01-14 DIAGNOSIS — F1590 Other stimulant use, unspecified, uncomplicated: Secondary | ICD-10-CM | POA: Insufficient documentation

## 2020-01-14 MED ORDER — METRONIDAZOLE 500 MG PO TABS
500.0000 mg | ORAL_TABLET | Freq: Once | ORAL | Status: AC
  Administered 2020-01-14: 500 mg via ORAL
  Filled 2020-01-14: qty 1

## 2020-01-14 MED ORDER — DOXYCYCLINE HYCLATE 100 MG PO TABS
100.0000 mg | ORAL_TABLET | Freq: Once | ORAL | Status: AC
  Administered 2020-01-14: 100 mg via ORAL
  Filled 2020-01-14: qty 1

## 2020-01-14 MED ORDER — SODIUM CHLORIDE 0.9 % IV BOLUS
1000.0000 mL | Freq: Once | INTRAVENOUS | Status: AC
  Administered 2020-01-14: 1000 mL via INTRAVENOUS

## 2020-01-14 MED ORDER — MORPHINE SULFATE (PF) 4 MG/ML IV SOLN
4.0000 mg | Freq: Once | INTRAVENOUS | Status: AC
  Administered 2020-01-14: 4 mg via INTRAVENOUS
  Filled 2020-01-14: qty 1

## 2020-01-14 MED ORDER — IOHEXOL 300 MG/ML  SOLN
75.0000 mL | Freq: Once | INTRAMUSCULAR | Status: AC | PRN
  Administered 2020-01-14: 75 mL via INTRAVENOUS

## 2020-01-14 MED ORDER — METRONIDAZOLE 500 MG PO TABS: 500.0000 mg | ORAL_TABLET | Freq: Two times a day (BID) | ORAL | 0 refills | Status: DC

## 2020-01-14 MED ORDER — CEFTRIAXONE SODIUM 500 MG IJ SOLR
500.0000 mg | Freq: Once | INTRAMUSCULAR | Status: AC
  Administered 2020-01-14: 500 mg via INTRAMUSCULAR
  Filled 2020-01-14: qty 500

## 2020-01-14 MED ORDER — DOXYCYCLINE HYCLATE 100 MG PO CAPS: 100.0000 mg | ORAL_CAPSULE | Freq: Two times a day (BID) | ORAL | 0 refills | Status: DC

## 2020-01-14 MED ORDER — ONDANSETRON HCL 4 MG PO TABS: 4.0000 mg | ORAL_TABLET | Freq: Three times a day (TID) | ORAL | 0 refills | Status: DC | PRN

## 2020-01-14 MED ORDER — ACETAMINOPHEN 500 MG PO TABS
1000.0000 mg | ORAL_TABLET | Freq: Once | ORAL | Status: AC
  Administered 2020-01-14: 1000 mg via ORAL
  Filled 2020-01-14: qty 2

## 2020-01-14 MED ORDER — STERILE WATER FOR INJECTION IJ SOLN
INTRAMUSCULAR | Status: AC
  Filled 2020-01-14: qty 10

## 2020-01-14 NOTE — ED Notes (Signed)
Patient transported to CT 

## 2020-01-14 NOTE — ED Triage Notes (Signed)
Patient was in the ED earlier however didn't answer when called for VS thought was patient had left was actually asleep and never left.

## 2020-01-14 NOTE — ED Notes (Signed)
Pt discharge instructions reviewed with the patient. The patient verbalized understanding of instructions. Pt discharged. 

## 2020-01-17 NOTE — ED Provider Notes (Signed)
[facility] EMERGENCY DEPARTMENT Provider Note   CSN: [id number] Arrival date & time: 01/14/20  0175     History Chief Complaint  Patient presents with  . Abdominal Pain    Allison Robles is a 24 y.o. female.  The history is provided by the patient.  Abdominal Pain Pain location:  Generalized Pain quality: aching   Pain radiates to:  Does not radiate Pain severity:  Mild Onset quality:  Gradual Timing:  Constant Chronicity:  New Context: not alcohol use   Relieved by:  None tried Worsened by:  Nothing Ineffective treatments:  None tried Associated symptoms: anorexia and nausea   Associated symptoms: no cough, no diarrhea and no hematochezia        Past Medical History:  Diagnosis Date  . Anemia   . Bacterial meningitis    as newborn, has hearing loss in left ear  . Medical history non-contributory     Patient Active Problem List   Diagnosis Date Noted  . SAB (spontaneous abortion) 01/06/2020  . Chronic migraine without aura without status migrainosus, not intractable 01/06/2020  . Short interval between pregnancies affecting pregnancy, antepartum 10/05/2019  . Alpha thalassemia silent carrier 01/14/2019    Past Surgical History:  Procedure Laterality Date  . NO PAST SURGERIES       OB History    Gravida  2   Para  1   Term  1   Preterm  0   AB  1   Living  1     SAB  1   TAB  0   Ectopic  0   Multiple  0   Live Births  1           Family History  Problem Relation Age of Onset  . Hypertension Mother     Social History   Tobacco Use  . Smoking status: Former Smoker    Types: Cigars    Quit date: 09/26/2018    Years since quitting: 1.3  . Smokeless tobacco: Never Used  . Tobacco comment: Pt smokes 3 black and milds a day  Vaping Use  . Vaping Use: Never used  Substance Use Topics  . Alcohol use: No  . Drug use: Yes    Types: Marijuana    Comment: Last use September 2021    Home  Medications Prior to Admission medications   Medication Sig Start Date End Date Taking? Authorizing Provider  Blood Pressure Monitoring DEVI 1 Device by Does not apply route once a week. 12/20/18   Nugent, Odie Sera, NP  doxycycline (VIBRAMYCIN) 100 MG capsule Take 1 capsule (100 mg total) by mouth 2 (two) times daily. One po bid x 7 days 01/14/20   Joycelin Radloff, Barbara Cower, MD  metroNIDAZOLE (FLAGYL) 500 MG tablet Take 1 tablet (500 mg total) by mouth 2 (two) times daily. One po bid x 7 days 01/14/20   Rutherford Alarie, Barbara Cower, MD  ondansetron (ZOFRAN) 4 MG tablet Take 1 tablet (4 mg total) by mouth every 8 (eight) hours as needed for nausea or vomiting. 01/14/20   Lashina Milles, Barbara Cower, MD    Allergies    Patient has no known allergies.  Review of Systems   Review of Systems  Respiratory: Negative for cough.   Gastrointestinal: Positive for abdominal pain, anorexia and nausea. Negative for diarrhea and hematochezia.  All other systems reviewed and are negative.   Physical Exam Updated Vital Signs BP 95/60 (BP Location: Right Arm)   Pulse 89   Temp  100.1 F (37.8 C) (Oral)   Resp 16   Ht 5\' 7"  (1.702 m)   Wt 51.7 kg   LMP 01/01/2020   SpO2 100%   BMI 17.85 kg/m   Physical Exam Vitals and nursing note reviewed.  Constitutional:      Appearance: She is well-developed.  HENT:     Head: Normocephalic and atraumatic.     Mouth/Throat:     Mouth: Mucous membranes are moist.     Pharynx: Oropharynx is clear.  Eyes:     Pupils: Pupils are equal, round, and reactive to light.  Cardiovascular:     Rate and Rhythm: Normal rate and regular rhythm.  Pulmonary:     Effort: No respiratory distress.     Breath sounds: No stridor.  Abdominal:     General: Abdomen is flat. There is no distension.  Musculoskeletal:        General: No swelling. Normal range of motion.     Cervical back: Normal range of motion.  Skin:    General: Skin is warm and dry.  Neurological:     General: No focal deficit present.      Mental Status: She is alert.     ED Results / Procedures / Treatments   Labs (all labs ordered are listed, but only abnormal results are displayed) Labs Reviewed - No data to display  EKG None  Radiology No results found.  Procedures Procedures (including critical care time)  Medications Ordered in ED Medications  morphine 4 MG/ML injection 4 mg (4 mg Intravenous Given 01/14/20 0447)  sodium chloride 0.9 % bolus 1,000 mL (0 mLs Intravenous Stopped 01/14/20 0626)  acetaminophen (TYLENOL) tablet 1,000 mg (1,000 mg Oral Given 01/14/20 0802)  iohexol (OMNIPAQUE) 300 MG/ML solution 75 mL (75 mLs Intravenous Contrast Given 01/14/20 0552)  cefTRIAXone (ROCEPHIN) injection 500 mg (500 mg Intramuscular Given 01/14/20 0804)  doxycycline (VIBRA-TABS) tablet 100 mg (100 mg Oral Given 01/14/20 0802)  metroNIDAZOLE (FLAGYL) tablet 500 mg (500 mg Oral Given 01/14/20 0802)    ED Course  I have reviewed the triage vital signs and the nursing notes.  Pertinent labs & imaging results that were available during my care of the patient were reviewed by me and considered in my medical decision making (see chart for details).    MDM Rules/Calculators/A&P                          Workup as above. No significant findings. Possibly PID. Will tx for same.   Final Clinical Impression(s) / ED Diagnoses Final diagnoses:  Generalized abdominal pain    Rx / DC Orders ED Discharge Orders         Ordered    doxycycline (VIBRAMYCIN) 100 MG capsule  2 times daily        01/14/20 0752    metroNIDAZOLE (FLAGYL) 500 MG tablet  2 times daily        01/14/20 0752    ondansetron (ZOFRAN) 4 MG tablet  Every 8 hours PRN        01/14/20 0752           Walker Sitar, 01/16/20, MD 01/17/20 0422

## 2020-03-04 ENCOUNTER — Encounter (HOSPITAL_COMMUNITY): Payer: Self-pay | Admitting: *Deleted

## 2020-03-04 ENCOUNTER — Ambulatory Visit (HOSPITAL_COMMUNITY)
Admission: EM | Admit: 2020-03-04 | Discharge: 2020-03-04 | Disposition: A | Discharge status: Home / Self Care | Payer: Medicaid Other | Attending: Internal Medicine | Admitting: Internal Medicine

## 2020-03-04 ENCOUNTER — Other Ambulatory Visit: Payer: Self-pay

## 2020-03-04 DIAGNOSIS — J069 Acute upper respiratory infection, unspecified: Secondary | ICD-10-CM

## 2020-03-04 DIAGNOSIS — U071 COVID-19: Secondary | ICD-10-CM | POA: Insufficient documentation

## 2020-03-04 LAB — SARS CORONAVIRUS 2 (TAT 6-24 HRS): SARS Coronavirus 2: POSITIVE — AB

## 2020-03-04 NOTE — ED Provider Notes (Signed)
[facility]    CSN: [id number] Arrival date & time: 03/04/20  1210      History   Chief Complaint Chief Complaint  Patient presents with  . Cough  . Headache  . Nasal Congestion    HPI Allison Robles is a 25 y.o. female presents to urgent care today with complaints of congestion.  Patient reports headache, rhinorrhea and congestion starting yesterday a.m.  States she felt like she had a fever yesterday but none today.  She denies any recent fever/chills, shortness of breath, chest pain, dizziness.  No known sick exposure.  Patient had 1 Covid vaccine.   Past Medical History:  Diagnosis Date  . Anemia   . Bacterial meningitis    as newborn, has hearing loss in left ear  . Medical history non-contributory     Patient Active Problem List   Diagnosis Date Noted  . SAB (spontaneous abortion) 01/06/2020  . Chronic migraine without aura without status migrainosus, not intractable 01/06/2020  . Short interval between pregnancies affecting pregnancy, antepartum 10/05/2019  . Alpha thalassemia silent carrier 01/14/2019    Past Surgical History:  Procedure Laterality Date  . NO PAST SURGERIES      OB History    Gravida  2   Para  1   Term  1   Preterm  0   AB  1   Living  1     SAB  1   IAB  0   Ectopic  0   Multiple  0   Live Births  1            Home Medications    Prior to Admission medications   Medication Sig Start Date End Date Taking? Authorizing Provider  Blood Pressure Monitoring DEVI 1 Device by Does not apply route once a week. 12/20/18   Nugent, Odie Sera, NP  doxycycline (VIBRAMYCIN) 100 MG capsule Take 1 capsule (100 mg total) by mouth 2 (two) times daily. One po bid x 7 days 01/14/20   Mesner, Barbara Cower, MD  metroNIDAZOLE (FLAGYL) 500 MG tablet Take 1 tablet (500 mg total) by mouth 2 (two) times daily. One po bid x 7 days 01/14/20   Mesner, Barbara Cower, MD  ondansetron (ZOFRAN) 4 MG tablet Take 1 tablet (4 mg total) by mouth every  8 (eight) hours as needed for nausea or vomiting. 01/14/20   Mesner, Barbara Cower, MD    Family History Family History  Problem Relation Age of Onset  . Hypertension Mother     Social History Social History   Tobacco Use  . Smoking status: Former Smoker    Types: Cigars    Quit date: 09/26/2018    Years since quitting: 1.4  . Smokeless tobacco: Never Used  . Tobacco comment: Pt smokes 3 black and milds a day  Vaping Use  . Vaping Use: Never used  Substance Use Topics  . Alcohol use: No  . Drug use: Yes    Types: Marijuana    Comment: Last use September 2021     Allergies   Patient has no known allergies.   Review of Systems As stated in HPI otherwise negative   Physical Exam Triage Vital Signs ED Triage Vitals  Enc Vitals Group     BP 03/04/20 1355 126/71     Pulse Rate 03/04/20 1355 98     Resp 03/04/20 1355 18     Temp 03/04/20 1355 98.3 F (36.8 C)     Temp Source 03/04/20 1355  Oral     SpO2 03/04/20 1355 97 %     Weight --      Height --      Head Circumference --      Peak Flow --      Pain Score 03/04/20 1353 0     Pain Loc --      Pain Edu? --      Excl. in GC? --    No data found.  Updated Vital Signs BP 126/71 (BP Location: Right Arm)   Pulse 98   Temp 98.3 F (36.8 C) (Oral)   Resp 18   LMP 01/26/2020   SpO2 97%   Visual Acuity Right Eye Distance:   Left Eye Distance:   Bilateral Distance:    Right Eye Near:   Left Eye Near:    Bilateral Near:     Physical Exam Constitutional:      General: She is not in acute distress.    Appearance: She is well-developed and normal weight. She is not ill-appearing or toxic-appearing.  HENT:     Mouth/Throat:     Mouth: Mucous membranes are moist.  Eyes:     General: No scleral icterus. Cardiovascular:     Rate and Rhythm: Normal rate and regular rhythm.     Heart sounds: Normal heart sounds.  Pulmonary:     Effort: Pulmonary effort is normal.     Breath sounds: Normal breath sounds. No  wheezing, rhonchi or rales.  Abdominal:     General: Bowel sounds are normal.     Palpations: Abdomen is soft.  Musculoskeletal:     Cervical back: Neck supple.  Lymphadenopathy:     Cervical: No cervical adenopathy.  Skin:    General: Skin is warm and dry.  Neurological:     Mental Status: She is alert.  Psychiatric:        Mood and Affect: Mood normal.        Behavior: Behavior normal.      UC Treatments / Results  Labs (all labs ordered are listed, but only abnormal results are displayed) Labs Reviewed  SARS CORONAVIRUS 2 (TAT 6-24 HRS)    EKG   Radiology No results found.  Procedures Procedures (including critical care time)  Medications Ordered in UC Medications - No data to display  Initial Impression / Assessment and Plan / UC Course  I have reviewed the triage vital signs and the nursing notes.  Pertinent labs & imaging results that were available during my care of the patient were reviewed by me and considered in my medical decision making (see chart for details).  Viral URI No evidence of acute sinusitis or pneumonia.  Symptoms suspicious for COVID-19.  PCR testing sent.  Supportive care with Tylenol/Motrin, Flonase and cetirizine as needed.  Isolation and follow-up precautions reviewed.  Reviewed expections re: course of current medical issues. Questions answered. Outlined signs and symptoms indicating need for more acute intervention. Pt verbalized understanding. AVS given  Final Clinical Impressions(s) / UC Diagnoses   Final diagnoses:  Viral upper respiratory tract infection     Discharge Instructions     COVID testing ordered. It will take between 2-3 days for test results. Someone will contact you regarding abnormal results or you can access your results through MyChart.   In the meantime you should... . Remain isolated in your home for 5 days from symptom onset AND greater than 48 hours of no fever without the use of fever-reducing  medication .  Get plenty of rest and fluids . Flonase for nasal congestion and/or runny nose . You can take OTC Zyrtec-D for nasal congestion, runny nose, and/or sore throat . Use these medications as directed for symptom relief . Use Tylenol or Ibuprofen as needed for fever or pain . Return or go to the ER for any worsening or new symptoms such as high fever, worsening cough, shortness of breath, chest tightness, chest pain, changes in mental status, etc.     ED Prescriptions    None     PDMP not reviewed this encounter.   Rolla Etienne, NP 03/04/20 [phone]

## 2020-03-04 NOTE — Discharge Instructions (Addendum)
COVID testing ordered. It will take between 2-3 days for test results. Someone will contact you regarding abnormal results or you can access your results through MyChart.   In the meantime you should... Remain isolated in your home for 5 days from symptom onset AND greater than 48 hours of no fever without the use of fever-reducing medication Get plenty of rest and fluids Flonase for nasal congestion and/or runny nose You can take OTC Zyrtec-D for nasal congestion, runny nose, and/or sore throat Use these medications as directed for symptom relief Use Tylenol or Ibuprofen as needed for fever or pain Return or go to the ER for any worsening or new symptoms such as high fever, worsening cough, shortness of breath, chest tightness, chest pain, changes in mental status, etc.

## 2020-03-04 NOTE — ED Triage Notes (Signed)
PT reports a HA , runny nose ,congestion.

## 2020-03-14 ENCOUNTER — Other Ambulatory Visit: Payer: Self-pay

## 2020-03-14 ENCOUNTER — Emergency Department (HOSPITAL_COMMUNITY)
Admission: EM | Admit: 2020-03-14 | Discharge: 2020-03-14 | Disposition: A | Discharge status: Home / Self Care | Payer: Medicaid Other | Attending: Emergency Medicine | Admitting: Emergency Medicine

## 2020-03-14 ENCOUNTER — Encounter (HOSPITAL_COMMUNITY): Payer: Self-pay

## 2020-03-14 DIAGNOSIS — Z1152 Encounter for screening for COVID-19: Secondary | ICD-10-CM | POA: Insufficient documentation

## 2020-03-14 DIAGNOSIS — Z87891 Personal history of nicotine dependence: Secondary | ICD-10-CM | POA: Diagnosis not present

## 2020-03-14 DIAGNOSIS — Z8616 Personal history of COVID-19: Secondary | ICD-10-CM | POA: Insufficient documentation

## 2020-03-14 DIAGNOSIS — Z20822 Contact with and (suspected) exposure to covid-19: Secondary | ICD-10-CM | POA: Diagnosis not present

## 2020-03-14 LAB — POC SARS CORONAVIRUS 2 AG -  ED: SARS Coronavirus 2 Ag: NEGATIVE

## 2020-03-14 NOTE — ED Triage Notes (Signed)
Pt tested positive for covid last Monday, pt sent here [facility] to get another test done. Pt is not currently pregnant

## 2020-03-14 NOTE — ED Provider Notes (Signed)
[facility] EMERGENCY DEPARTMENT Provider Note   CSN: [id number] Arrival date & time: 03/14/20  1014     History Chief Complaint  Patient presents with  . Covid Positive    Allison Robles is a 25 y.o. female.  Patient presents to the emergency department for COVID test.  She states that she tested positive on 03/04/2020.  She states that she was sent from her maternity house, along with her son, to get another test.  She states that her symptoms have resolved and she is currently feeling better.        Past Medical History:  Diagnosis Date  . Anemia   . Bacterial meningitis    as newborn, has hearing loss in left ear  . Medical history non-contributory     Patient Active Problem List   Diagnosis Date Noted  . SAB (spontaneous abortion) 01/06/2020  . Chronic migraine without aura without status migrainosus, not intractable 01/06/2020  . Short interval between pregnancies affecting pregnancy, antepartum 10/05/2019  . Alpha thalassemia silent carrier 01/14/2019    Past Surgical History:  Procedure Laterality Date  . NO PAST SURGERIES       OB History    Gravida  2   Para  1   Term  1   Preterm  0   AB  1   Living  1     SAB  1   IAB  0   Ectopic  0   Multiple  0   Live Births  1           Family History  Problem Relation Age of Onset  . Hypertension Mother     Social History   Tobacco Use  . Smoking status: Former Smoker    Types: Cigars    Quit date: 09/26/2018    Years since quitting: 1.4  . Smokeless tobacco: Never Used  . Tobacco comment: Pt smokes 3 black and milds a day  Vaping Use  . Vaping Use: Never used  Substance Use Topics  . Alcohol use: No  . Drug use: Yes    Types: Marijuana    Comment: Last use September 2021    Home Medications Prior to Admission medications   Medication Sig Start Date End Date Taking? Authorizing Provider  Blood Pressure Monitoring DEVI 1 Device by Does not apply route  once a week. 12/20/18   Nugent, Odie Sera, NP  doxycycline (VIBRAMYCIN) 100 MG capsule Take 1 capsule (100 mg total) by mouth 2 (two) times daily. One po bid x 7 days 01/14/20   Mesner, Barbara Cower, MD  metroNIDAZOLE (FLAGYL) 500 MG tablet Take 1 tablet (500 mg total) by mouth 2 (two) times daily. One po bid x 7 days 01/14/20   Mesner, Barbara Cower, MD  ondansetron (ZOFRAN) 4 MG tablet Take 1 tablet (4 mg total) by mouth every 8 (eight) hours as needed for nausea or vomiting. 01/14/20   Mesner, Barbara Cower, MD    Allergies    Patient has no known allergies.  Review of Systems   Review of Systems  Constitutional: Negative for fever.  Respiratory: Negative for cough and shortness of breath.   Gastrointestinal: Negative for nausea and vomiting.    Physical Exam Updated Vital Signs There were no vitals taken for this visit.  Physical Exam Vitals and nursing note reviewed.  Constitutional:      Appearance: She is well-developed.  HENT:     Head: Normocephalic and atraumatic.  Eyes:  Conjunctiva/sclera: Conjunctivae normal.  Pulmonary:     Effort: No respiratory distress.  Musculoskeletal:     Cervical back: Normal range of motion and neck supple.  Skin:    General: Skin is warm and dry.  Neurological:     Mental Status: She is alert.     ED Results / Procedures / Treatments   Labs (all labs ordered are listed, but only abnormal results are displayed) Labs Reviewed  POC SARS CORONAVIRUS 2 AG -  ED    EKG None  Radiology No results found.  Procedures Procedures (including critical care time)  Medications Ordered in ED Medications - No data to display  ED Course  I have reviewed the triage vital signs and the nursing notes.  Pertinent labs & imaging results that were available during my care of the patient were reviewed by me and considered in my medical decision making (see chart for details).  Patient seen and examined.  Patient appears well, in no distress.  COVID testing  ordered.  Patient be discharged.       MDM Rules/Calculators/A&P                          Patient here for COVID test.  Symptoms seem to have improved.   Final Clinical Impression(s) / ED Diagnoses Final diagnoses:  Encounter for laboratory testing for COVID-19 virus    Rx / DC Orders ED Discharge Orders    None       Renne Crigler, PA-C 03/14/20 1307    Melene Plan, DO 03/14/20 1310

## 2020-03-14 NOTE — Discharge Instructions (Addendum)
Your covid test results will be available in MyChart.

## 2020-03-14 NOTE — ED Notes (Signed)
Pt in Peds with her Child.

## 2021-01-02 IMAGING — US OBSTETRIC <14 WK ULTRASOUND
1 series · 15 of 26 positions shown · non-contrast
Comparison: None.

CLINICAL DATA: Uncertain dating.

EXAM:
OBSTETRIC <14 WK ULTRASOUND
TECHNIQUE: Transabdominal ultrasound was performed for evaluation of the
gestation as well as the maternal uterus and adnexal regions.

[Series 1: obstetric <14 wk ultrasound · 15 of 26 slices shown]
[im 1/26]
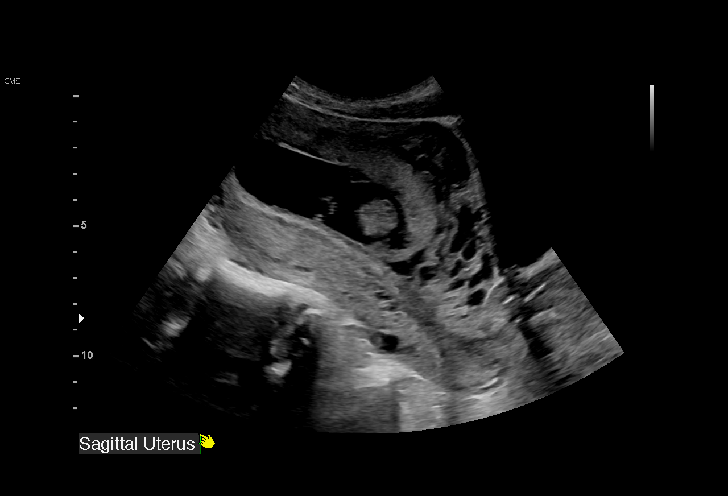
[im 3/26]
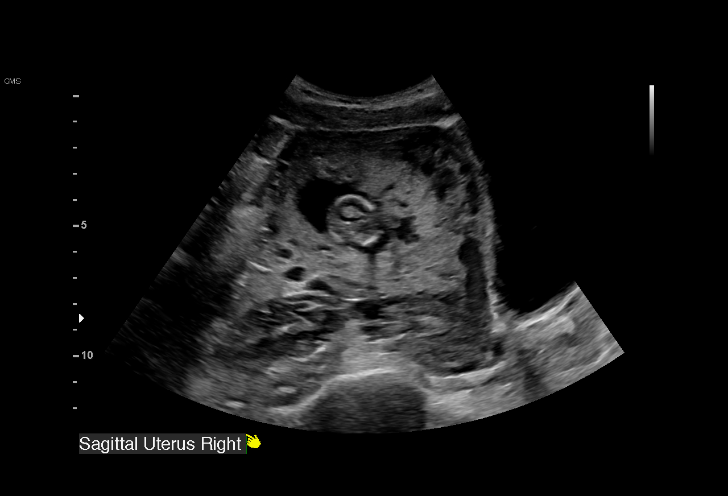
[im 5/26]
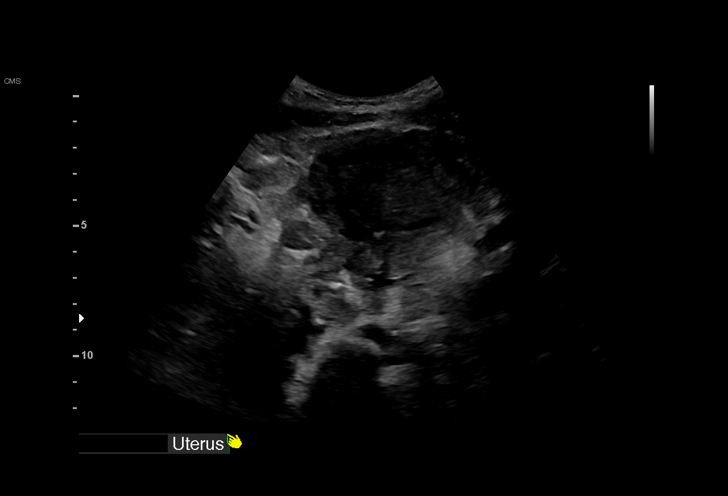
[im 7/26]
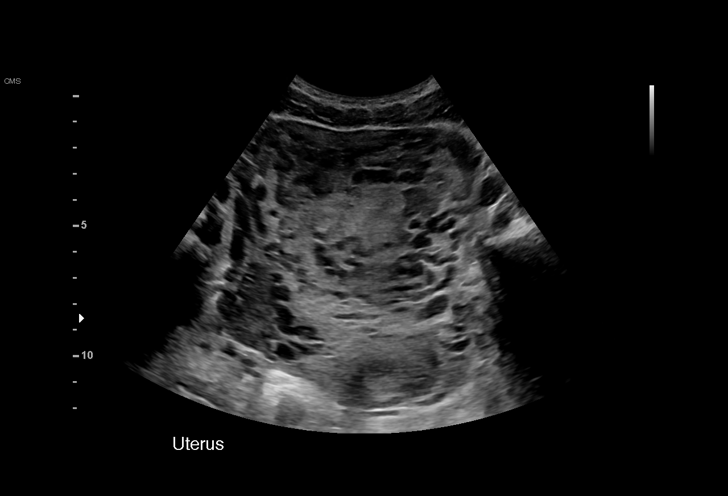
[im 8/26]
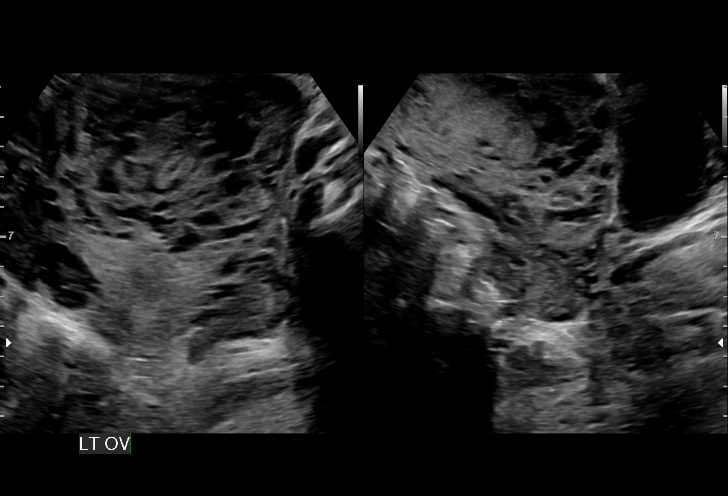
[im 10/26]
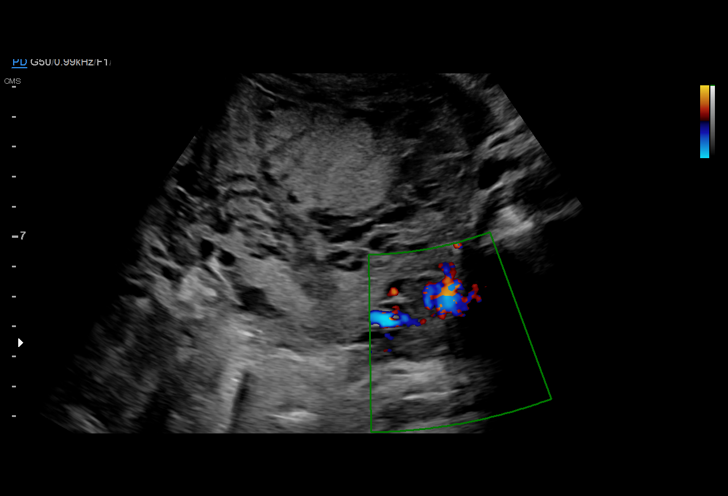
[im 12/26]
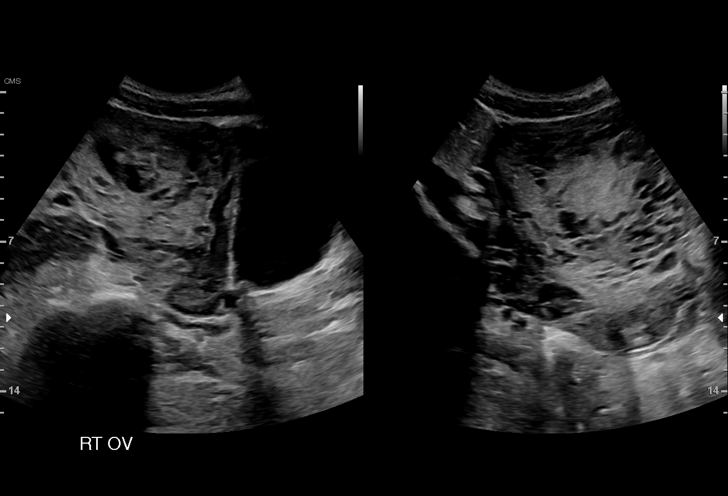
[im 14/26]
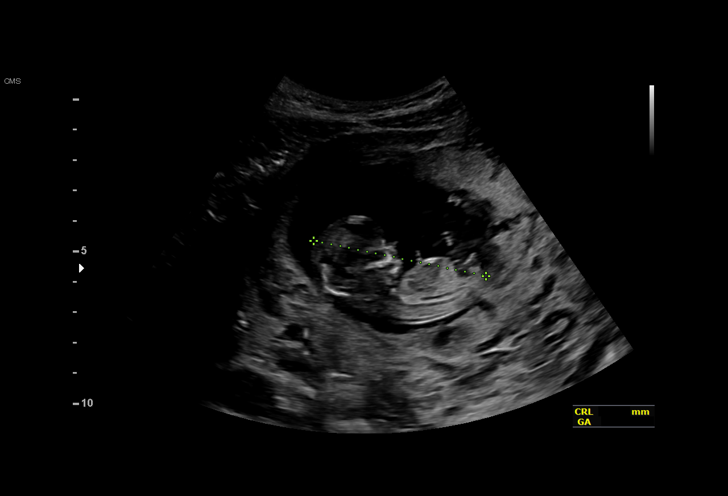
[im 15/26]
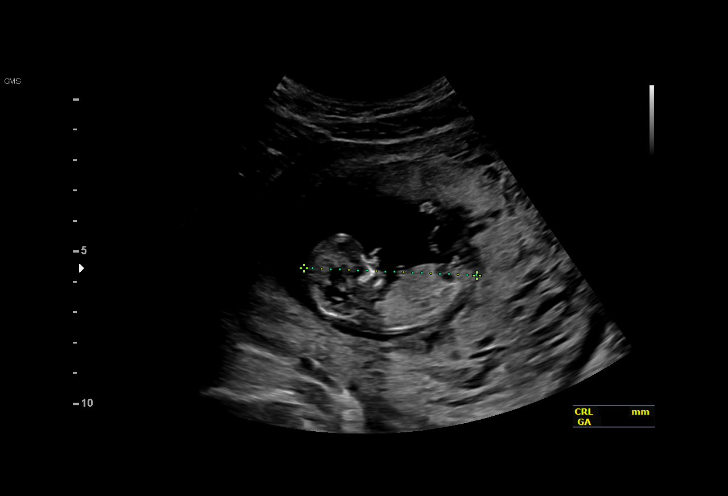
[im 17/26]
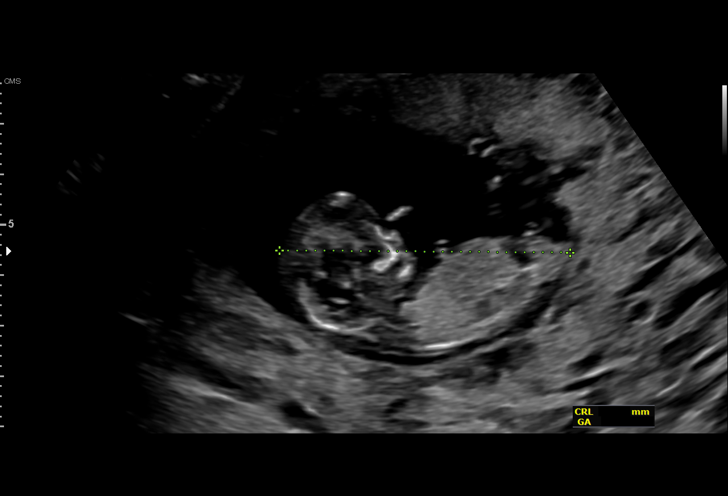
[im 19/26]
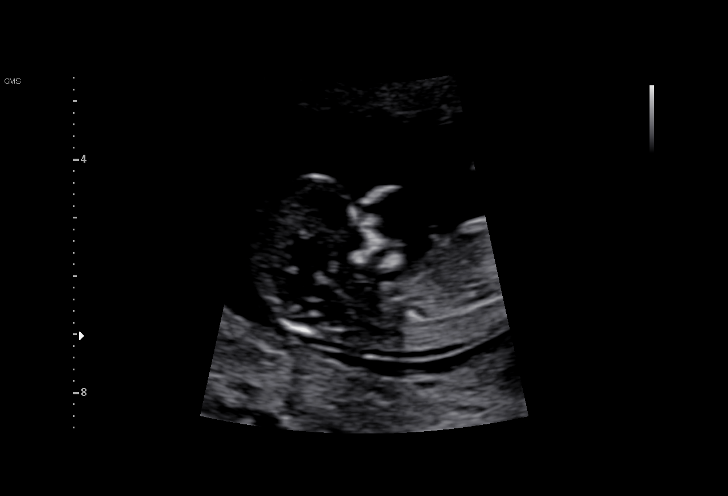
[im 20/26]
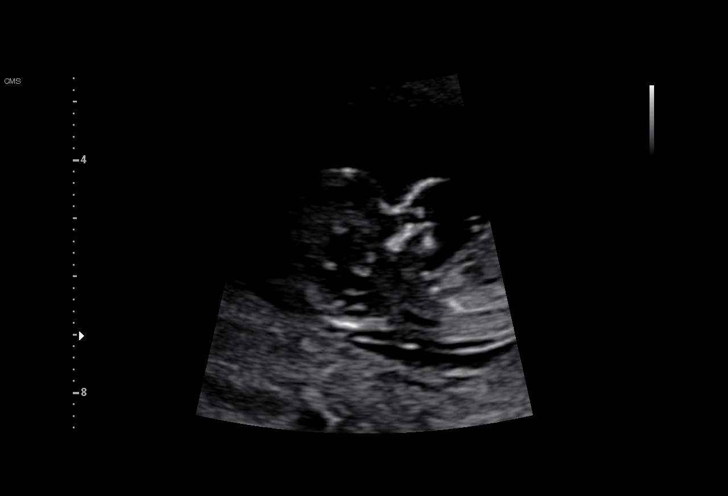
[im 22/26]
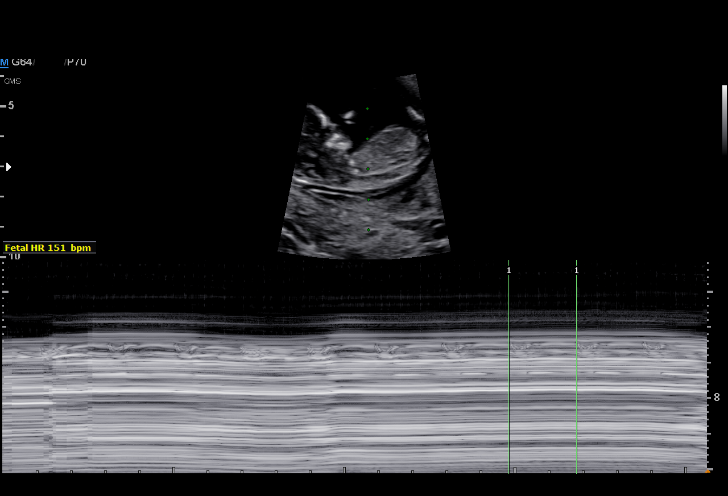
[im 24/26]
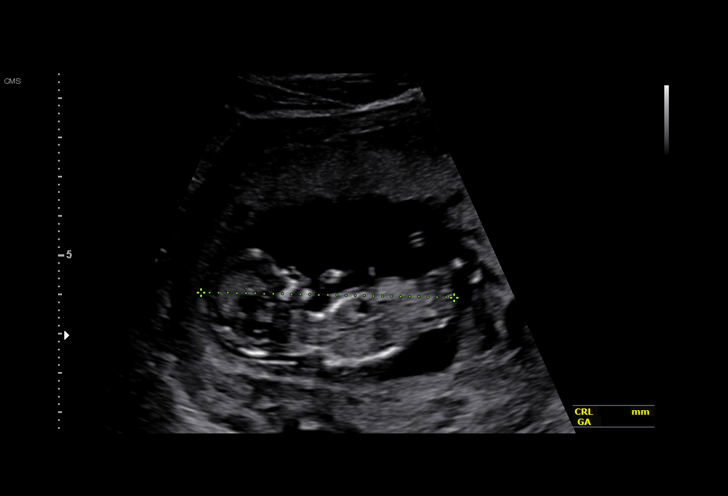
[im 26/26]
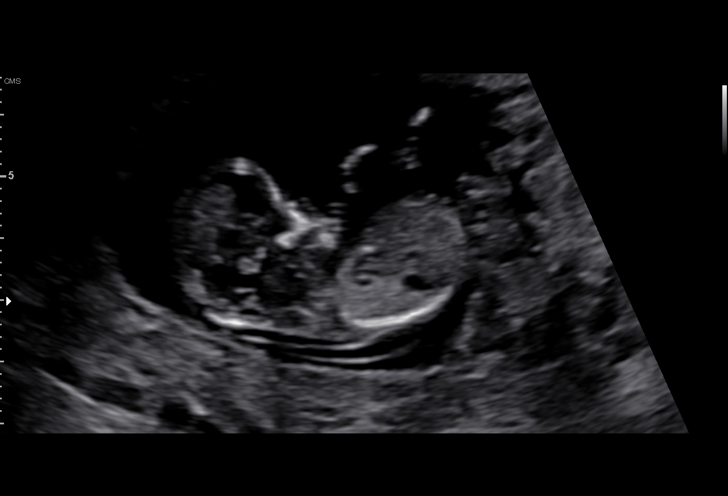

[15 of 26 positions shown; findings below may reference images not displayed]

FINDINGS: Intrauterine gestational sac: Single

Yolk sac:  Not Visualized.

Embryo:  Visualized.

Cardiac Activity: Visualized.

Heart Rate: 151 bpm

CRL:   60.1 mm   12 w 4 d                  US EDC: 04/29/2019

Subchorionic hemorrhage:  None visualized.

Maternal uterus/adnexae: Normal right and left ovaries.
IMPRESSION: Single live intrauterine gestation.  No subchorionic hemorrhage.

## 2021-01-21 ENCOUNTER — Ambulatory Visit (HOSPITAL_COMMUNITY)
Admission: EM | Admit: 2021-01-21 | Discharge: 2021-01-21 | Disposition: A | Discharge status: Home / Self Care | Payer: Medicaid Other | Attending: Physician Assistant | Admitting: Physician Assistant

## 2021-01-21 ENCOUNTER — Encounter (HOSPITAL_COMMUNITY): Payer: Self-pay | Admitting: Emergency Medicine

## 2021-01-21 ENCOUNTER — Other Ambulatory Visit: Payer: Self-pay

## 2021-01-21 DIAGNOSIS — R1010 Upper abdominal pain, unspecified: Secondary | ICD-10-CM | POA: Insufficient documentation

## 2021-01-21 DIAGNOSIS — Z113 Encounter for screening for infections with a predominantly sexual mode of transmission: Secondary | ICD-10-CM | POA: Insufficient documentation

## 2021-01-21 DIAGNOSIS — K2900 Acute gastritis without bleeding: Secondary | ICD-10-CM | POA: Diagnosis present

## 2021-01-21 DIAGNOSIS — N898 Other specified noninflammatory disorders of vagina: Secondary | ICD-10-CM | POA: Insufficient documentation

## 2021-01-21 LAB — POCT URINALYSIS DIPSTICK, ED / UC
Bilirubin Urine: NEGATIVE
Glucose, UA: NEGATIVE mg/dL
Hgb urine dipstick: NEGATIVE
Ketones, ur: 15 mg/dL — AB
Nitrite: NEGATIVE
Protein, ur: NEGATIVE mg/dL
Specific Gravity, Urine: >=1.03 (ref 1.005–1.030)
Urobilinogen, UA: 0.2 mg/dL (ref 0.0–1.0)
pH: 5.5 (ref 5.0–8.0)

## 2021-01-21 LAB — POC URINE PREG, ED: Preg Test, Ur: NEGATIVE

## 2021-01-21 MED ORDER — PANTOPRAZOLE SODIUM 40 MG PO TBEC: 40.0000 mg | DELAYED_RELEASE_TABLET | Freq: Every day | ORAL | 0 refills | Status: DC

## 2021-01-21 MED ORDER — LIDOCAINE VISCOUS HCL 2 % MT SOLN
15.0000 mL | Freq: Once | OROMUCOSAL | Status: AC
  Administered 2021-01-21: 19:00:00 15 mL via ORAL

## 2021-01-21 MED ORDER — ALUM & MAG HYDROXIDE-SIMETH 200-200-20 MG/5ML PO SUSP
30.0000 mL | Freq: Once | ORAL | Status: AC
  Administered 2021-01-21: 19:00:00 30 mL via ORAL

## 2021-01-21 MED ORDER — ALUM & MAG HYDROXIDE-SIMETH 200-200-20 MG/5ML PO SUSP
ORAL | Status: AC
  Filled 2021-01-21: qty 30

## 2021-01-21 MED ORDER — SUCRALFATE 1 G PO TABS: 1.0000 g | ORAL_TABLET | Freq: Three times a day (TID) | ORAL | 0 refills | Status: DC

## 2021-01-21 MED ORDER — LIDOCAINE VISCOUS HCL 2 % MT SOLN
OROMUCOSAL | Status: AC
  Filled 2021-01-21: qty 15

## 2021-01-21 NOTE — ED Provider Notes (Signed)
[facility]    CSN: [id number] Arrival date & time: 01/21/21  1609      History   Chief Complaint Chief Complaint  Patient presents with   Abdominal Pain   Diarrhea    HPI Allison Robles is a 25 y.o. female.   Patient presents today with a several week history of generalized abdominal pain that is worse in the upper abdomen.  She reports pain is persistent without identifiable trigger.  Pain is rated 9 on a 0-10 pain scale, described as cramping, no aggravating relieving factors identified.  He has not tried any over-the-counter medication for symptom management.  She denies history of gastrointestinal disorder and has not had abdominal surgeries including cholecystectomy or appendectomy.  She denies any association with food.  She denies any known sick contacts or additional symptoms including fever, cough, congestion.  She does report associated diarrhea which she describes as 3-4 watery bowel movements per day without blood or mucus.  Reports associated nausea but denies any vomiting.  She does report some vaginal irritation with thick white discharge.  She has not tried any over-the-counter medication for symptom management.  Denies any recent travel, antibiotic use, medication changes, known sick contacts.  She denies any changes to personal hygiene products including soaps or detergents.  Reports LMP the end of October that ended the beginning of November.  She is interested in pregnancy test here.   Past Medical History:  Diagnosis Date   Anemia    Bacterial meningitis    as newborn, has hearing loss in left ear   Medical history non-contributory     Patient Active Problem List   Diagnosis Date Noted   SAB (spontaneous abortion) 01/06/2020   Chronic migraine without aura without status migrainosus, not intractable 01/06/2020   Short interval between pregnancies affecting pregnancy, antepartum 10/05/2019   Alpha thalassemia silent carrier 01/14/2019    Past  Surgical History:  Procedure Laterality Date   NO PAST SURGERIES      OB History     Gravida  2   Para  1   Term  1   Preterm  0   AB  1   Living  1      SAB  1   IAB  0   Ectopic  0   Multiple  0   Live Births  1            Home Medications    Prior to Admission medications   Medication Sig Start Date End Date Taking? Authorizing Provider  pantoprazole (PROTONIX) 40 MG tablet Take 1 tablet (40 mg total) by mouth daily. 01/21/21  Yes Jaevian Shean K, PA-C  sucralfate (CARAFATE) 1 g tablet Take 1 tablet (1 g total) by mouth 4 (four) times daily -  with meals and at bedtime. 01/21/21  Yes Juanantonio Stolar, Noberto Retort, PA-C  Blood Pressure Monitoring DEVI 1 Device by Does not apply route once a week. 12/20/18   Nugent, Odie Sera, NP    Family History Family History  Problem Relation Age of Onset   Hypertension Mother     Social History Social History   Tobacco Use   Smoking status: Former    Types: Cigars    Quit date: 09/26/2018    Years since quitting: 2.3   Smokeless tobacco: Never   Tobacco comments:    Pt smokes 3 black and milds a day  Vaping Use   Vaping Use: Never used  Substance Use Topics  Alcohol use: No   Drug use: Yes    Types: Marijuana    Comment: Last use September 2021     Allergies   Patient has no known allergies.   Review of Systems Review of Systems  Constitutional:  Positive for activity change. Negative for appetite change, fatigue and fever.  Respiratory:  Negative for cough and shortness of breath.   Cardiovascular:  Negative for chest pain.  Gastrointestinal:  Positive for abdominal pain, diarrhea and nausea. Negative for vomiting.  Genitourinary:  Positive for vaginal discharge. Negative for dysuria, frequency, urgency, vaginal bleeding and vaginal pain.  Musculoskeletal:  Negative for arthralgias and myalgias.  Neurological:  Negative for dizziness, light-headedness and headaches.    Physical Exam Triage Vital Signs ED  Triage Vitals  Enc Vitals Group     BP 01/21/21 1740 106/72     Pulse Rate 01/21/21 1740 80     Resp 01/21/21 1740 17     Temp 01/21/21 1740 98.3 F (36.8 C)     Temp Source 01/21/21 1740 Oral     SpO2 01/21/21 1740 100 %     Weight --      Height --      Head Circumference --      Peak Flow --      Pain Score 01/21/21 1738 9     Pain Loc --      Pain Edu? --      Excl. in GC? --    No data found.  Updated Vital Signs BP 106/72 (BP Location: Left Arm)   Pulse 80   Temp 98.3 F (36.8 C) (Oral)   Resp 17   LMP 12/26/2020   SpO2 100%   Visual Acuity Right Eye Distance:   Left Eye Distance:   Bilateral Distance:    Right Eye Near:   Left Eye Near:    Bilateral Near:     Physical Exam Vitals reviewed.  Constitutional:      General: She is awake. She is not in acute distress.    Appearance: Normal appearance. She is well-developed. She is not ill-appearing.     Comments: Very pleasant female appears stated age in no acute distress sitting comfortably in exam room  HENT:     Head: Normocephalic and atraumatic.     Mouth/Throat:     Pharynx: Uvula midline. No oropharyngeal exudate or posterior oropharyngeal erythema.  Cardiovascular:     Rate and Rhythm: Normal rate and regular rhythm.     Heart sounds: Normal heart sounds, S1 normal and S2 normal. No murmur heard. Pulmonary:     Effort: Pulmonary effort is normal.     Breath sounds: Normal breath sounds. No wheezing, rhonchi or rales.     Comments: Clear to auscultation bilaterally Abdominal:     General: Bowel sounds are normal.     Palpations: Abdomen is soft.     Tenderness: There is abdominal tenderness in the right upper quadrant and epigastric area. There is no right CVA tenderness, left CVA tenderness, guarding or rebound.     Comments: Tenderness to palpation throughout epigastrium.  No evidence of acute abdomen on physical exam.    Genitourinary:    Comments: Exam deferred Psychiatric:        Behavior:  Behavior is cooperative.     UC Treatments / Results  Labs (all labs ordered are listed, but only abnormal results are displayed) Labs Reviewed  POCT URINALYSIS DIPSTICK, ED / UC - Abnormal; Notable for the  following components:      Result Value   Ketones, ur 15 (*)    Leukocytes,Ua SMALL (*)    All other components within normal limits  URINE CULTURE  POC URINE PREG, ED  CERVICOVAGINAL ANCILLARY ONLY    EKG   Radiology No results found.  Procedures Procedures (including critical care time)  Medications Ordered in UC Medications  alum & mag hydroxide-simeth (MAALOX/MYLANTA) 200-200-20 MG/5ML suspension 30 mL (30 mLs Oral Given 01/21/21 1852)    And  lidocaine (XYLOCAINE) 2 % viscous mouth solution 15 mL (15 mLs Oral Given 01/21/21 1852)    Initial Impression / Assessment and Plan / UC Course  I have reviewed the triage vital signs and the nursing notes.  Pertinent labs & imaging results that were available during my care of the patient were reviewed by me and considered in my medical decision making (see chart for details).     Vital signs and physical exam reassuring today; no indication for emergent evaluation or imaging.  Patient had significant pain improvement with GI cocktail in clinic.  Suspect gastritis as etiology of symptoms and was started on Protonix as well as Carafate to manage symptoms.  Recommended she avoid alcohol and NSAIDs and follow-up with GI specialist.  She was given contact information for local specialist and encouraged to call them to schedule an appointment.  Discussed alarm symptoms that warrant emergent evaluation.  Strict return precautions given to which she expressed understanding.  Urine pregnancy was negative.  UA showed trace leukocyte esterase she denies any symptoms of UTI so we will send this off for culture but defer antibiotic treatment.  She did report some vaginal irritation but we will send off STI swab and defer treatment until  results are obtained.  Discussed alarm symptoms that warrant emergent evaluation.  Strict return precautions given to which she expressed understanding.  Final Clinical Impressions(s) / UC Diagnoses   Final diagnoses:  Upper abdominal pain  Acute gastritis, presence of bleeding unspecified, unspecified gastritis type  Vaginal irritation  Routine screening for STI (sexually transmitted infection)     Discharge Instructions      I am concerned that you have inflammation of your stomach lining.  Please start pantoprazole in the morning 30 minutes before eating.  Take Carafate before meals and at bedtime to coat your stomach.  Avoid spicy/acidic/fatty foods.  Avoid alcohol as well as NSAIDs including aspirin, ibuprofen/Advil, naproxen/Aleve.  Follow-up with a GI specialist.  If you have any severe stomach pain, nausea, vomiting, blood in your stool, dark stools you need to go to the emergency room.     ED Prescriptions     Medication Sig Dispense Auth. Provider   pantoprazole (PROTONIX) 40 MG tablet Take 1 tablet (40 mg total) by mouth daily. 30 tablet Yasaman Kolek K, PA-C   sucralfate (CARAFATE) 1 g tablet Take 1 tablet (1 g total) by mouth 4 (four) times daily -  with meals and at bedtime. 28 tablet Adalai Perl, Noberto Retort, PA-C      PDMP not reviewed this encounter.   Jeani Hawking, PA-C 01/21/21 1921

## 2021-01-21 NOTE — ED Triage Notes (Signed)
Having abd pains with n, diarrhea for couple days. Reports that works at a [employer] and will get hot a faint feeling. Reports will see little red spots when wipes. Has some vaginal itching.

## 2021-01-21 NOTE — Discharge Instructions (Signed)
I am concerned that you have inflammation of your stomach lining.  Please start pantoprazole in the morning 30 minutes before eating.  Take Carafate before meals and at bedtime to coat your stomach.  Avoid spicy/acidic/fatty foods.  Avoid alcohol as well as NSAIDs including aspirin, ibuprofen/Advil, naproxen/Aleve.  Follow-up with a GI specialist.  If you have any severe stomach pain, nausea, vomiting, blood in your stool, dark stools you need to go to the emergency room.

## 2021-01-22 ENCOUNTER — Telehealth (HOSPITAL_COMMUNITY): Payer: Self-pay | Admitting: Emergency Medicine

## 2021-01-22 LAB — CERVICOVAGINAL ANCILLARY ONLY
Bacterial Vaginitis (gardnerella): POSITIVE — AB
Candida Glabrata: NEGATIVE
Candida Vaginitis: POSITIVE — AB
Chlamydia: POSITIVE — AB
Comment: NEGATIVE
Comment: NEGATIVE
Comment: NEGATIVE
Comment: NEGATIVE
Comment: NEGATIVE
Comment: NORMAL
Neisseria Gonorrhea: NEGATIVE
Trichomonas: NEGATIVE

## 2021-01-22 LAB — URINE CULTURE: Culture: <10000 — AB

## 2021-01-22 MED ORDER — METRONIDAZOLE 500 MG PO TABS: 500.0000 mg | ORAL_TABLET | Freq: Two times a day (BID) | ORAL | 0 refills | Status: DC

## 2021-01-22 MED ORDER — DOXYCYCLINE HYCLATE 100 MG PO CAPS: 100.0000 mg | ORAL_CAPSULE | Freq: Two times a day (BID) | ORAL | 0 refills | Status: AC

## 2021-01-22 MED ORDER — FLUCONAZOLE 150 MG PO TABS: 150.0000 mg | ORAL_TABLET | Freq: Once | ORAL | 0 refills | Status: AC

## 2021-06-18 IMAGING — US US MFM OB FOLLOW-UP
1 series · 13 of 28 positions shown · non-contrast
Comparison: none

[Series 1: us mfm ob follow-up · 42 acquisitions, 13 frames shown]
[im 2/42]
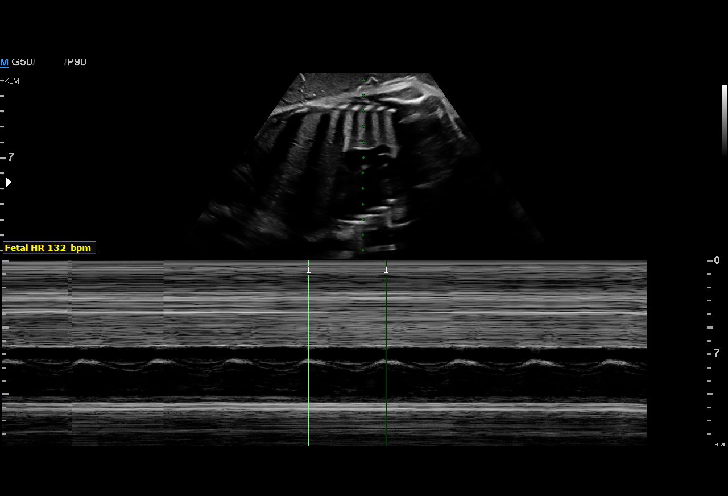
[im 5/42]
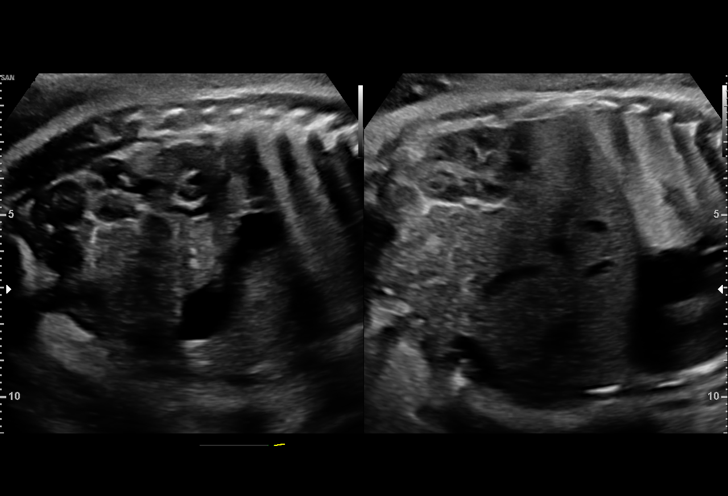
[im 8/42]
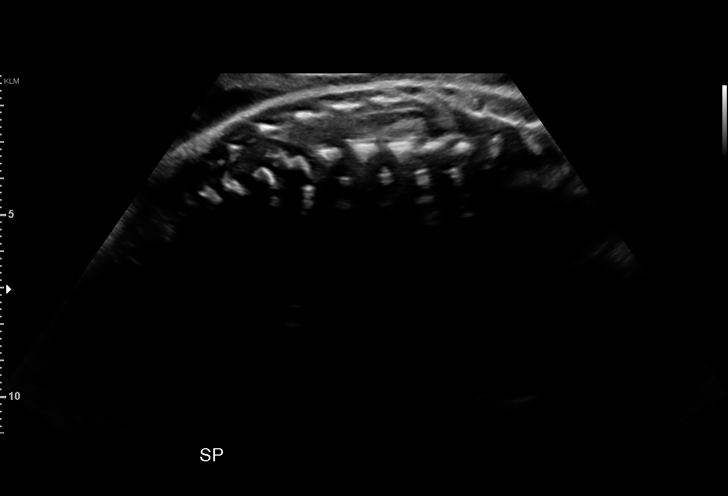
[im 11/42]
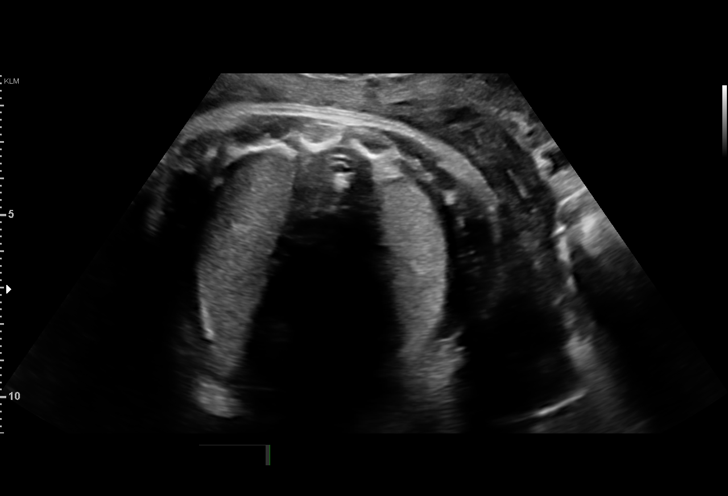
[im 14/42]
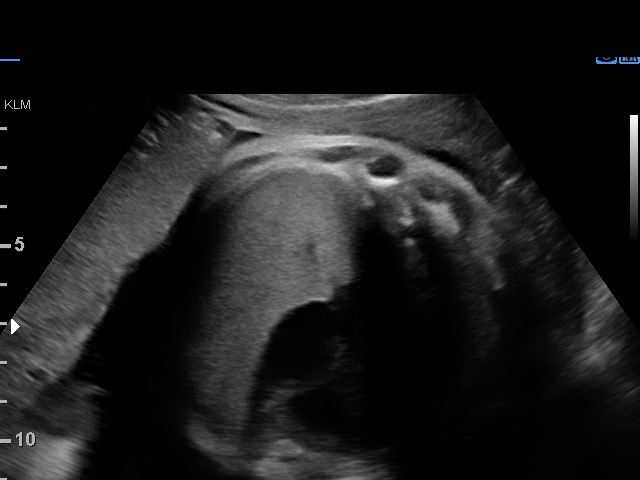
[im 17/42]
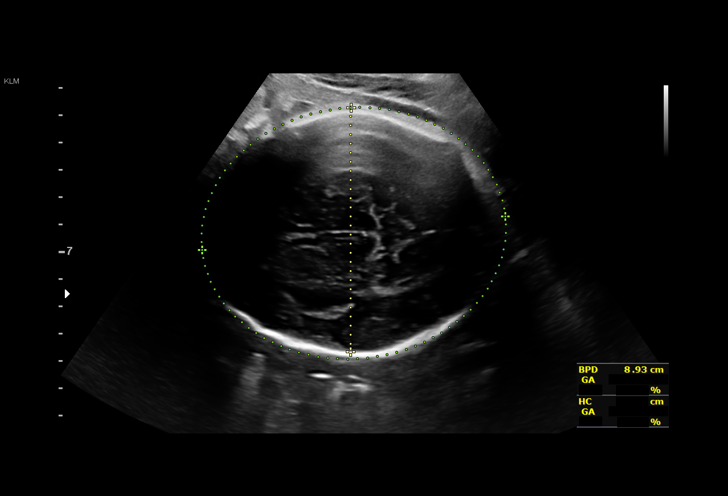
[im 22/42]
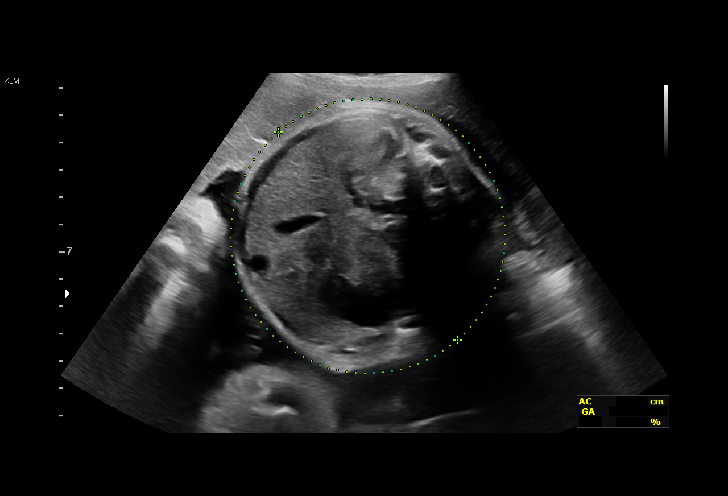
[im 25/42]
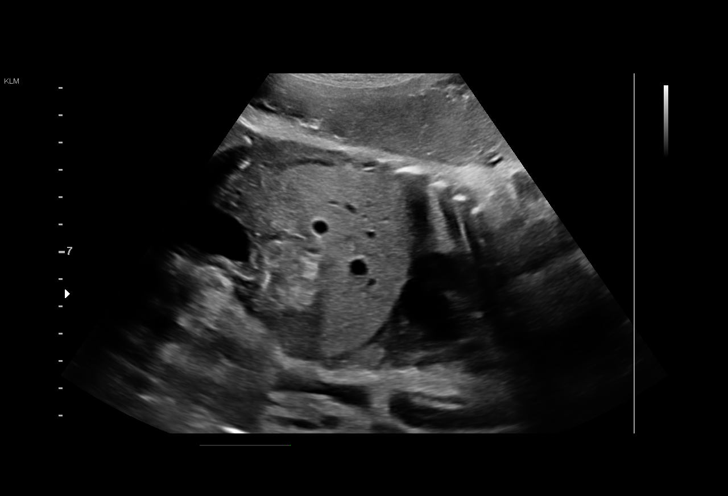
[im 28/42]
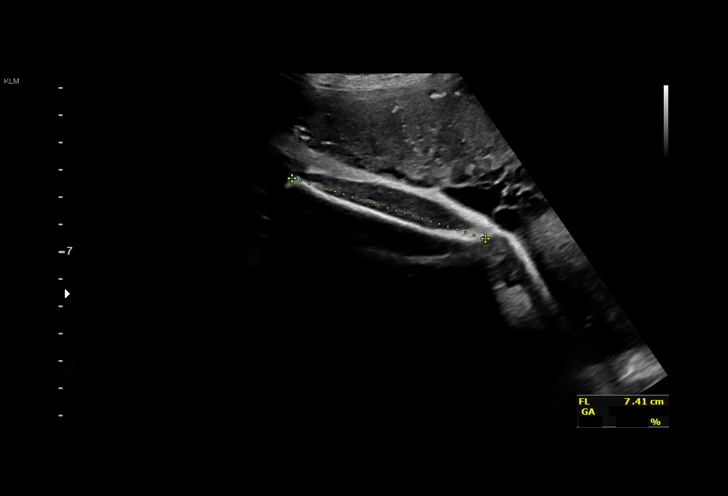
[im 31/42]
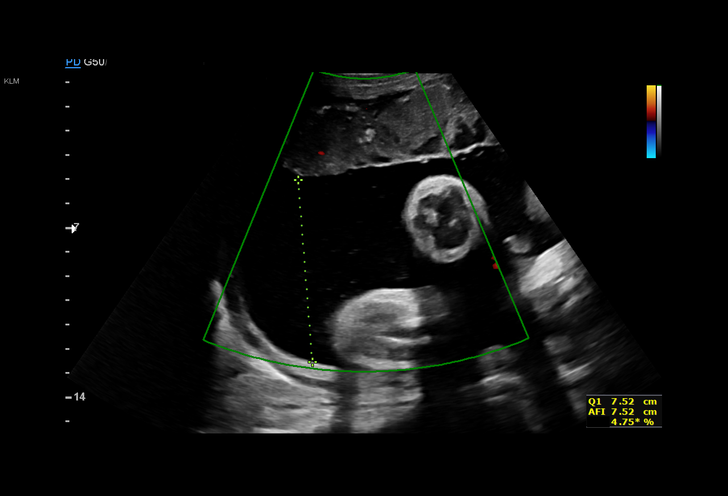
[im 34/42]
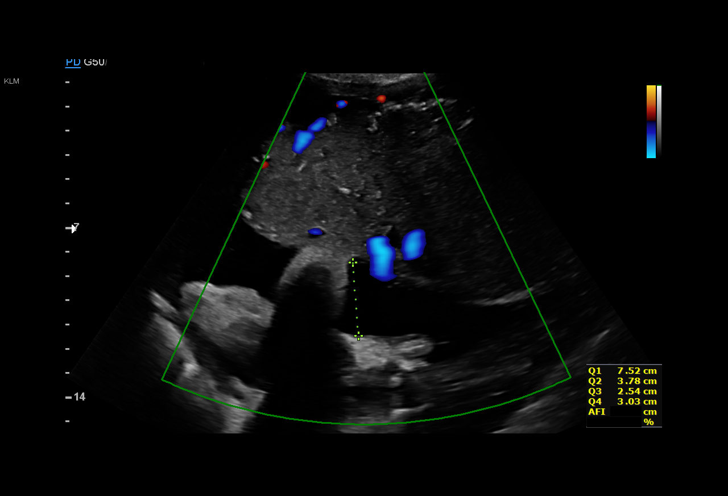
[im 37/42]
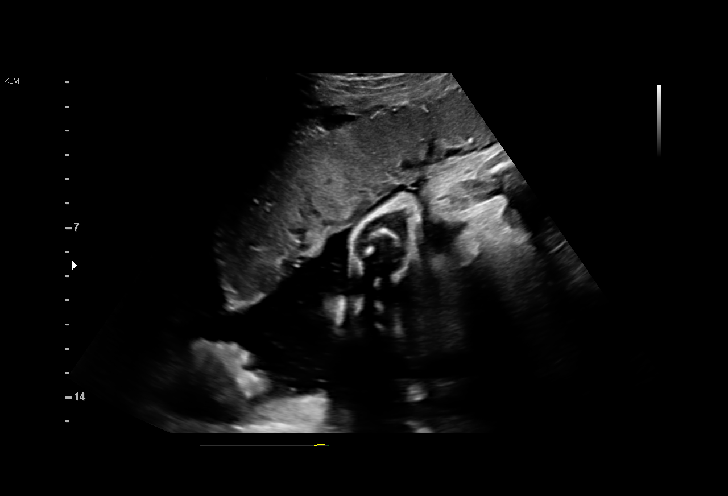
[im 40/42]
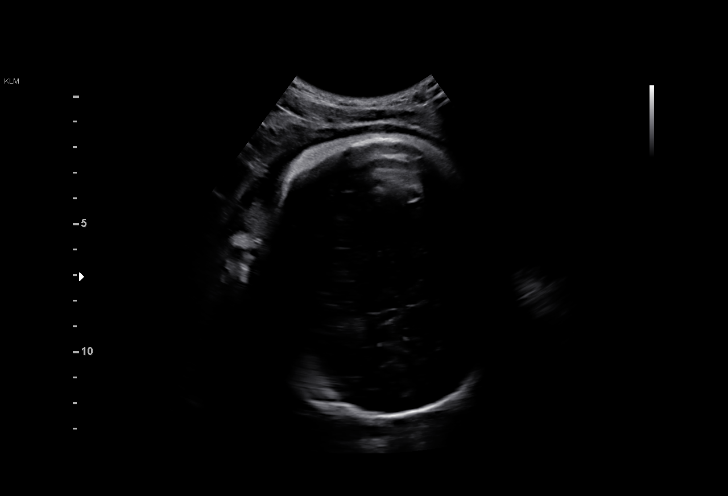

[13 of 28 positions shown; findings below may reference images not displayed]

----------------------------------------------------------------------

 ----------------------------------------------------------------------
Indications

  Fetal abnormality - other known or
  suspected (bilateral UTD)
  36 weeks gestation of pregnancy
 ----------------------------------------------------------------------
Vital Signs

                                                Height:        5'6"
Fetal Evaluation

 Num Of Fetuses:         1
 Fetal Heart Rate(bpm):  132
 Cardiac Activity:       Observed
 Presentation:           Cephalic
 Placenta:               Anterior
 P. Cord Insertion:      Previously Visualized

 Amniotic Fluid
 AFI FV:      Within normal limits

 AFI Sum(cm)     %Tile       Largest Pocket(cm)
 16.87           63

 RUQ(cm)       RLQ(cm)       LUQ(cm)        LLQ(cm)

Biometry

 BPD:      89.3  mm     G. Age:  36w 1d         53  %    CI:        76.95   %    70 - 86
                                                         FL/HC:      22.9   %    20.1 -
 HC:      322.4  mm     G. Age:  36w 3d         21  %    HC/AC:      1.03        0.93 -
 AC:       314   mm     G. Age:  35w 2d         30  %    FL/BPD:     82.6   %    71 - 87
 FL:       73.8  mm     G. Age:  37w 5d         79  %    FL/AC:      23.5   %    20 - 24

 Est. FW:    7081  gm      6 lb 5 oz     46  %
OB History

 Gravidity:    2
 TOP:          1
Gestational Age

 U/S Today:     36w 3d                                        EDD:   04/29/19
 Best:          36w 3d     Det. By:  Previous Ultrasound      EDD:   04/29/19
                                     (10/19/18)
Anatomy

 Cranium:               Appears normal         Aortic Arch:            Previously seen
 Cavum:                 Previously seen        Ductal Arch:            Previously seen
 Ventricles:            Appears normal         Diaphragm:              Appears normal
 Choroid Plexus:        Previously seen        Stomach:                Appears normal, left
                                                                       sided
 Cerebellum:            Previously seen        Abdomen:                Appears normal
 Posterior Fossa:       Previously seen        Abdominal Wall:         Previously seen
 Nuchal Fold:           Not applicable (>20    Cord Vessels:           Previously seen
                        wks GA)
 Face:                  Orbits and profile     Kidneys:                Bilateral UTD
                        previously seen
 Lips:                  Previously seen        Bladder:                Appears normal
 Thoracic:              Appears normal         Spine:                  Appears normal
 Heart:                 Previously seen        Upper Extremities:      Previously seen
 RVOT:                  Previously seen        Lower Extremities:      Previously seen
 LVOT:                  Previously seen

 Other:  Heels and 5th digit visualized prev. IVC/SVC, 3VV and 3VTV
         visualized. Nasal bone visualized. Midline falx and cerebellar vermis
         visualized. Maxilla, mandible and tongue visualized. Lungs and neck
         visualized.
Cervix Uterus Adnexa

 Cervix
 Not visualized (advanced GA >18wks)
Comments

 This patient was seen for a follow up growth scan due to
 renal pelvis dilatation that was noted during her prior
 ultrasound exams.  She denies any problems since her last
 exam.
 She was informed that the fetal growth and amniotic fluid
 level appears appropriate for her gestational age.
 Mild bilateral pyelectasis measuring 0.7 cm on the right and
 1.0 cm on the left continues to be noted on today's exam.
 The patient was advised that the renal pelvis dilatation will
 most likely resolve after birth.  She was advised to notify her
 pediatrician regarding the mild bilateral pyelectasis that was
 noted during her prenatal ultrasounds.  Her pediatrician will
 order additional imaging studies of the baby's kidneys after
 birth if necessary.
 Follow-up as indicated.

## 2022-01-14 ENCOUNTER — Ambulatory Visit: Payer: Medicaid Other

## 2022-01-25 IMAGING — US US OB COMP LESS 14 WK
1 series · 15 of 28 positions shown · non-contrast
Comparison: Prior obstetric ultrasounds, no recent comparison is
for this just station
COMPARISON: Prior obstetric ultrasounds, no recent comparison is
for this just station

Addendum:
CLINICAL DATA: No fetal heart tones. Positive urine pregnancy test
on 10/05/2019 in a patient with last menstrual period reported at
08/03/2019, 14 weeks 2 days by last menstrual period.

EXAM:
OBSTETRIC <14 WK ULTRASOUND
TECHNIQUE: Transabdominal ultrasound was performed for evaluation of the
gestation as well as the maternal uterus and adnexal regions.

[Series 1: us ob comp less 14 wk · 40 acquisitions, 15 frames shown]
[im 1/40]
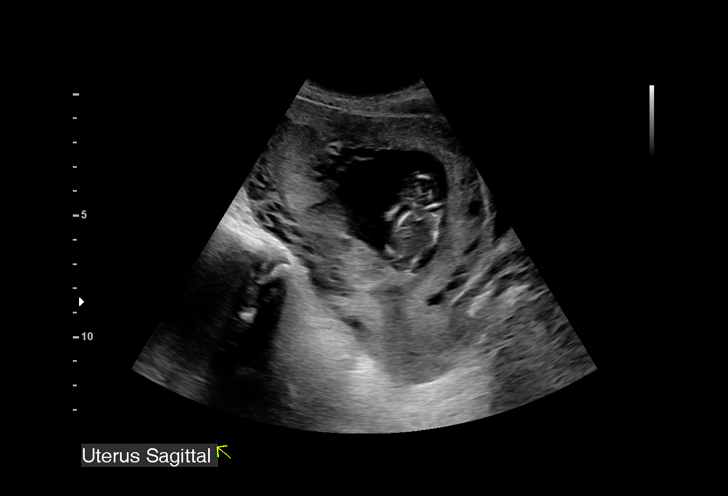
[im 3/40]
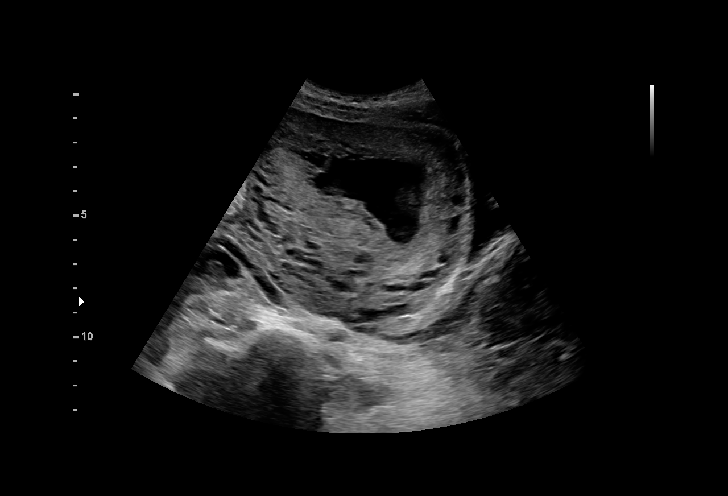
[im 6/40]
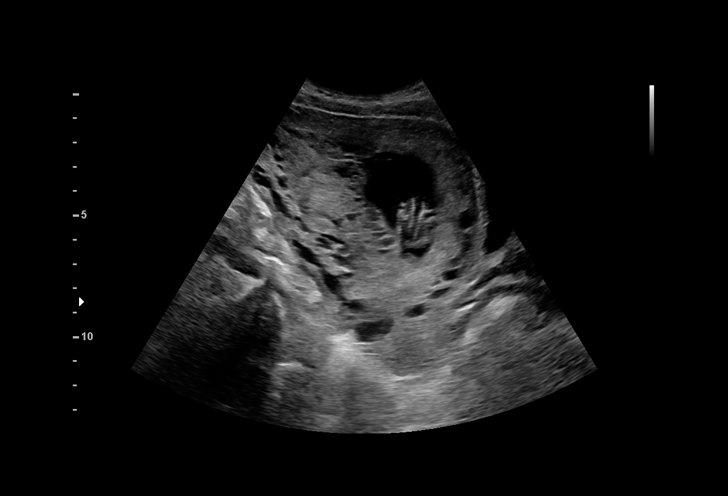
[im 9/40]
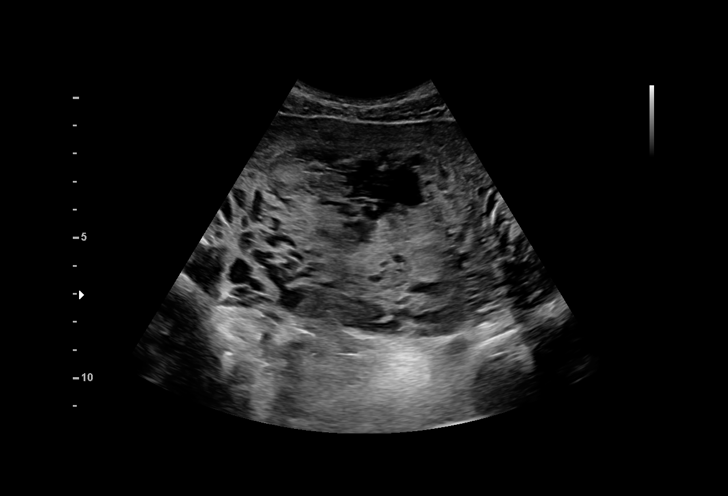
[im 12/40]
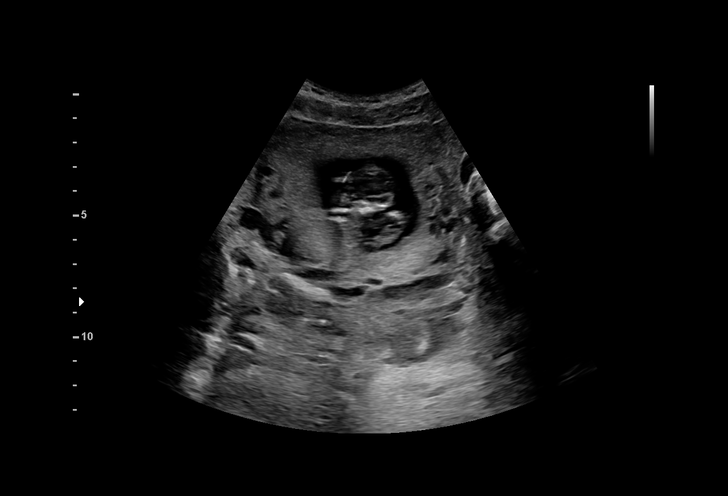
[im 15/40]
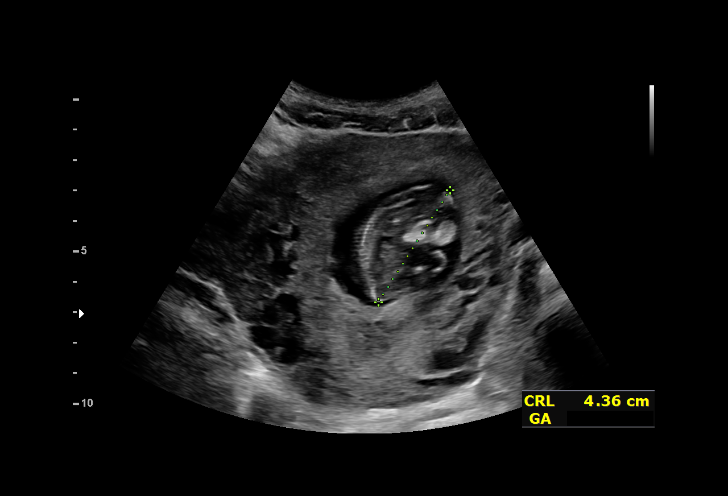
[im 18/40]
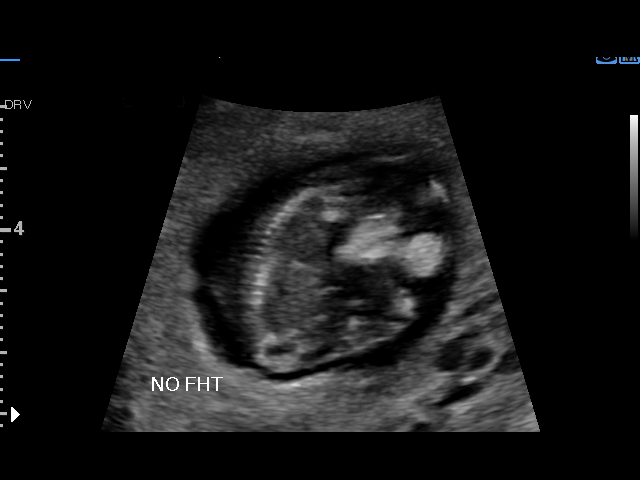
[im 21/40]
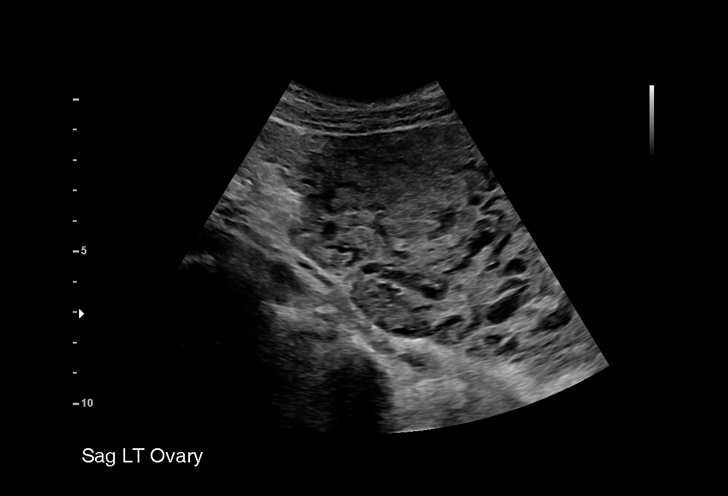
[im 22/40]
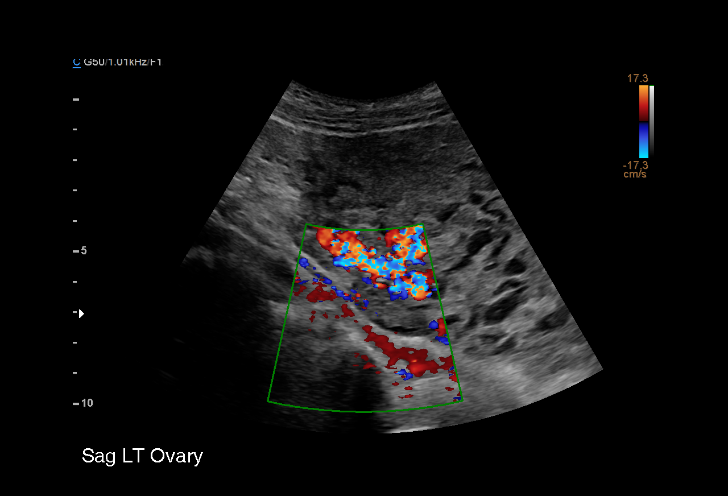
[im 25/40]
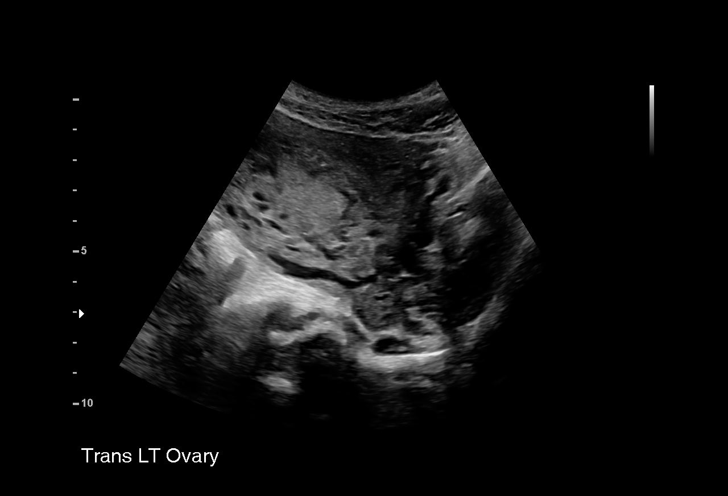
[im 28/40]
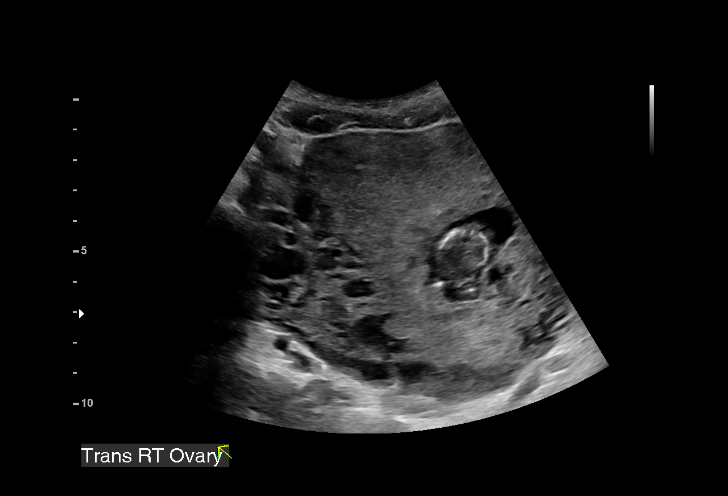
[im 31/40]
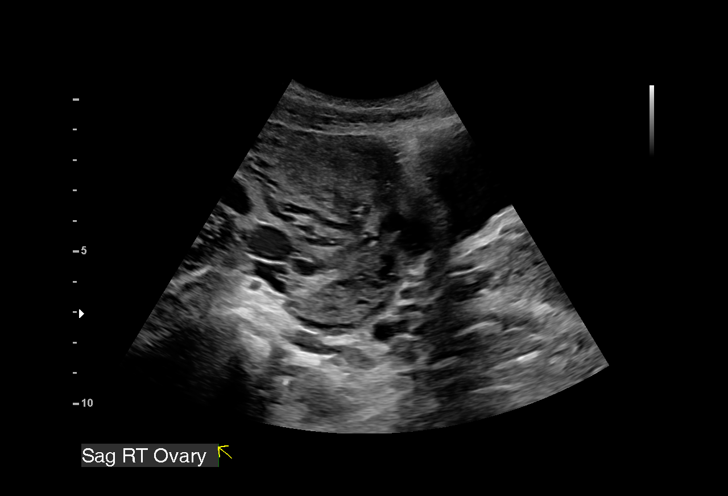
[im 34/40]
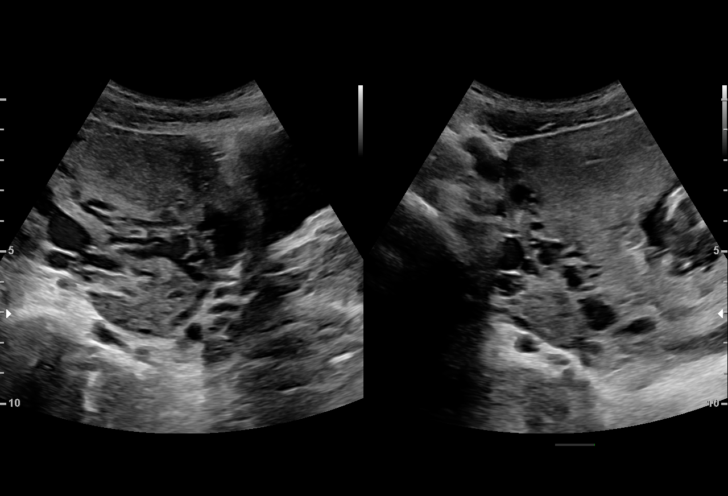
[im 37/40]
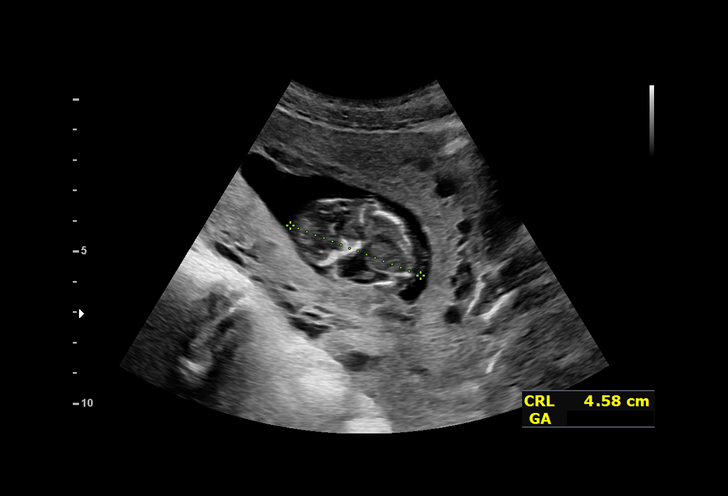
[im 40/40]
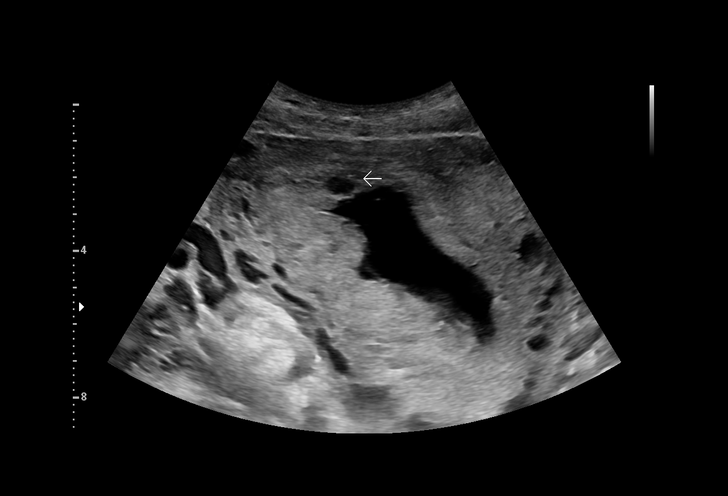

[15 of 28 positions shown; findings below may reference images not displayed]

FINDINGS: Intrauterine gestational sac: Single

Yolk sac:  No

Embryo:  Yes

Cardiac Activity: No

Heart Rate: 0 bpm

CRL: 45.1 mm   11 w 2 d                  US EDC: 05/30/2020

Subchorionic hemorrhage:  Small

Maternal uterus/adnexae: No free fluid. LEFT ovary measures 3.6 x
1.7 x 2.2 cm.

RIGHT ovary measures 3.7 x 1.8 x 2.5 cm.
IMPRESSION: 1. Crown-rump length of 45.1 mm without fetal heart tones.
Constellation of findings compatible with fetal demise.

ADDENDUM:
The report impression states "without fetal heart tones" this should
read "without fetal cardiac activity". There is otherwise no change
in the impression which is compatible with fetal demise.

*** End of Addendum ***
FINDINGS: Intrauterine gestational sac: Single

Yolk sac:  No

Embryo:  Yes

Cardiac Activity: No

Heart Rate: 0 bpm

CRL: 45.1 mm   11 w 2 d                  US EDC: 05/30/2020

Subchorionic hemorrhage:  Small

Maternal uterus/adnexae: No free fluid. LEFT ovary measures 3.6 x
1.7 x 2.2 cm.

RIGHT ovary measures 3.7 x 1.8 x 2.5 cm.
IMPRESSION: 1. Crown-rump length of 45.1 mm without fetal heart tones.
Constellation of findings compatible with fetal demise.

## 2022-01-29 IMAGING — US US OB TRANSVAGINAL
1 series · 15 of 16 positions shown · non-contrast
Comparison: 11/11/2019

CLINICAL DATA: 11 week intrauterine fetal demise, vaginal bleeding

EXAM:
TRANSVAGINAL OB ULTRASOUND
TECHNIQUE: Transvaginal ultrasound was performed for complete evaluation of the
gestation as well as the maternal uterus, adnexal regions, and
pelvic cul-de-sac.

[Series 1: us ob transvaginal · 15 of 16 slices shown]
[im 1/16]
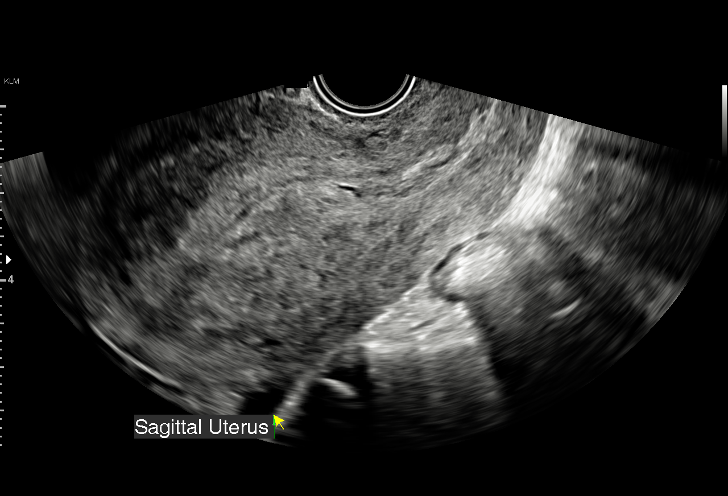
[im 2/16]
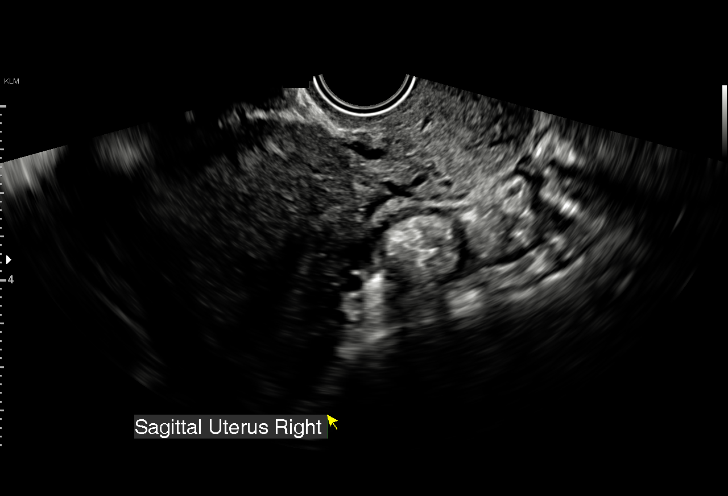
[im 3/16]
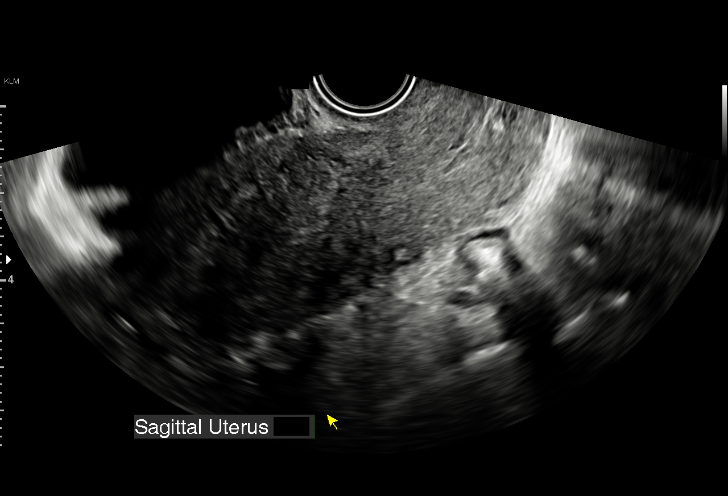
[im 4/16]
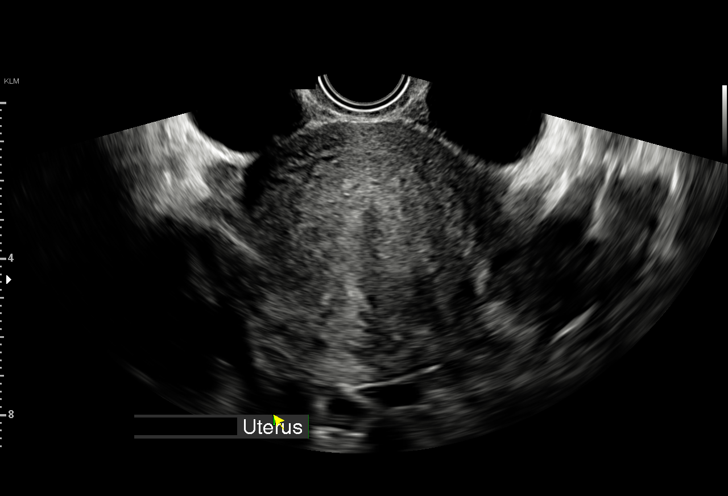
[im 5/16]
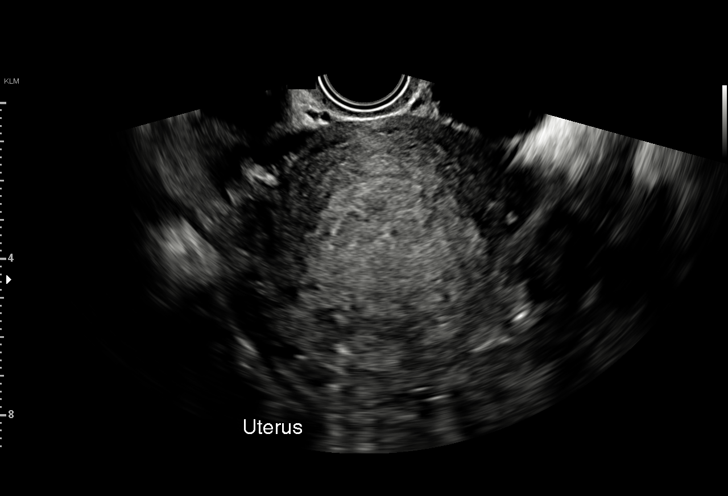
[im 6/16]
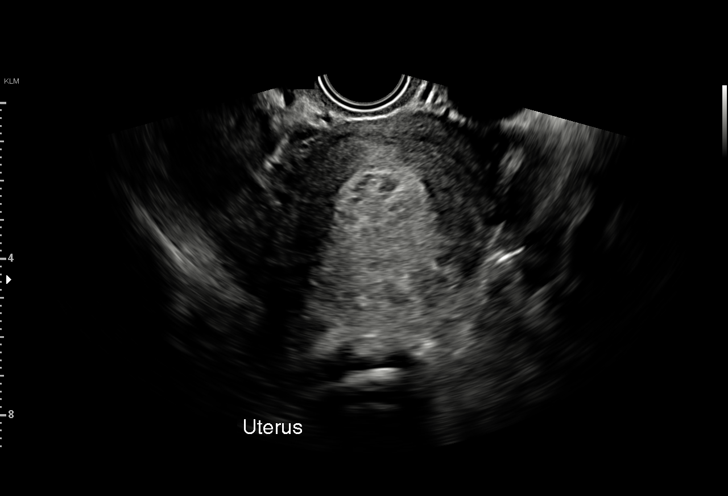
[im 7/16]
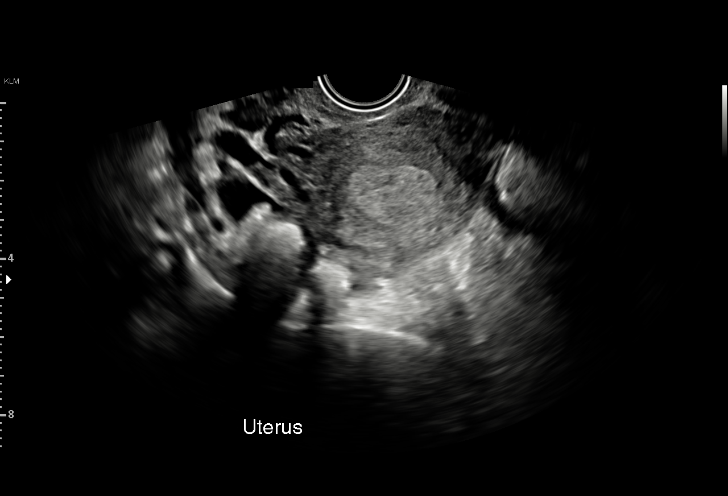
[im 9/16]
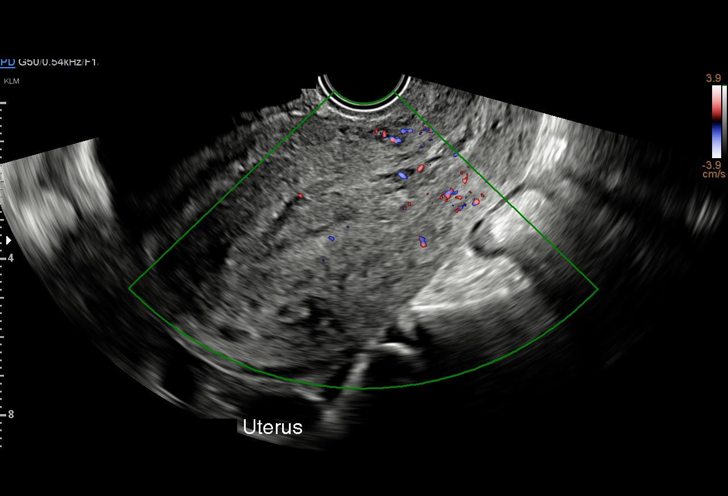
[im 10/16]
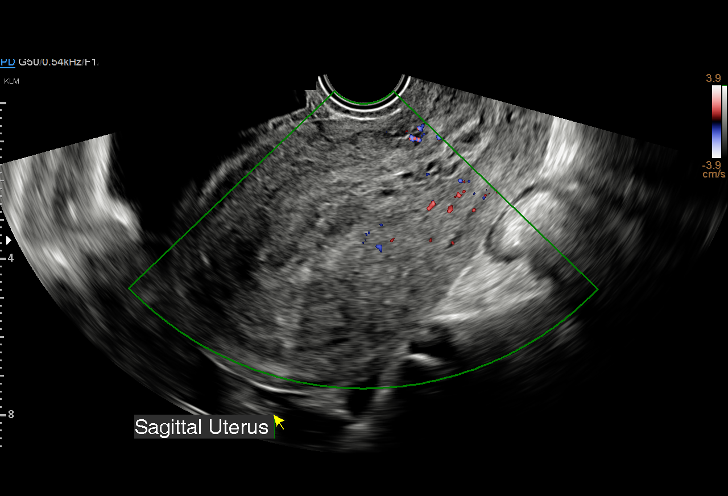
[im 11/16]
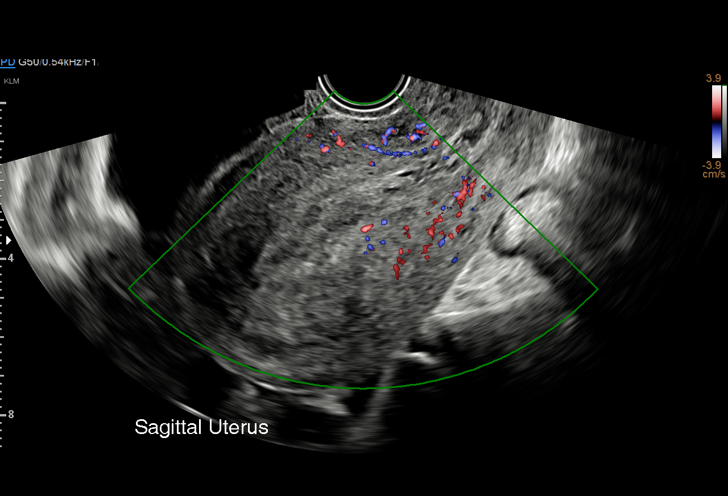
[im 12/16]
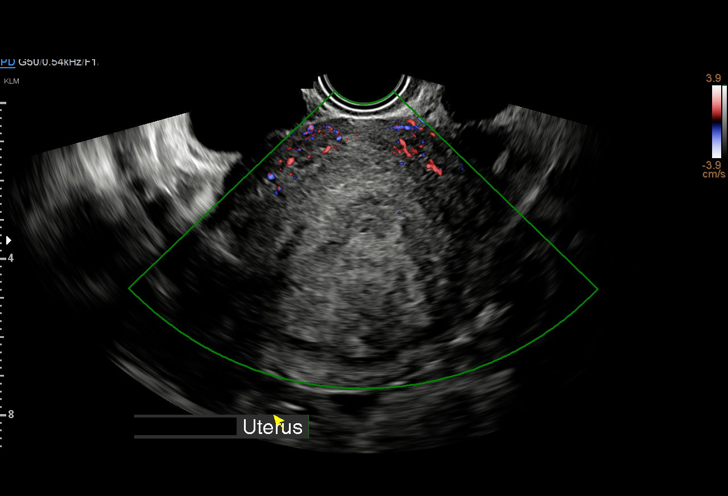
[im 13/16]
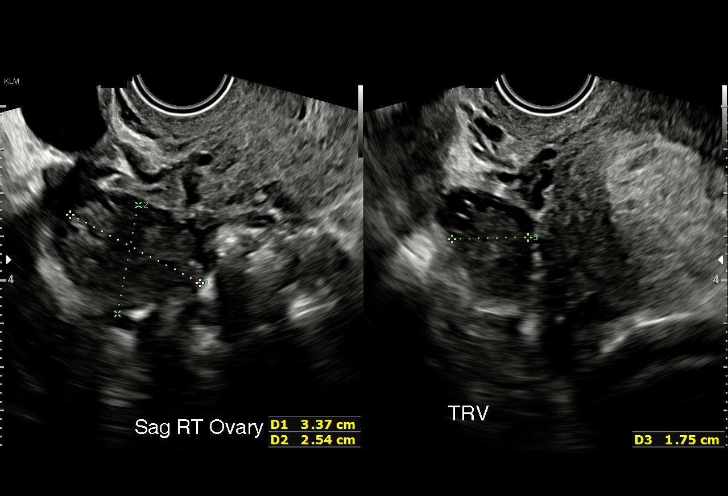
[im 14/16]
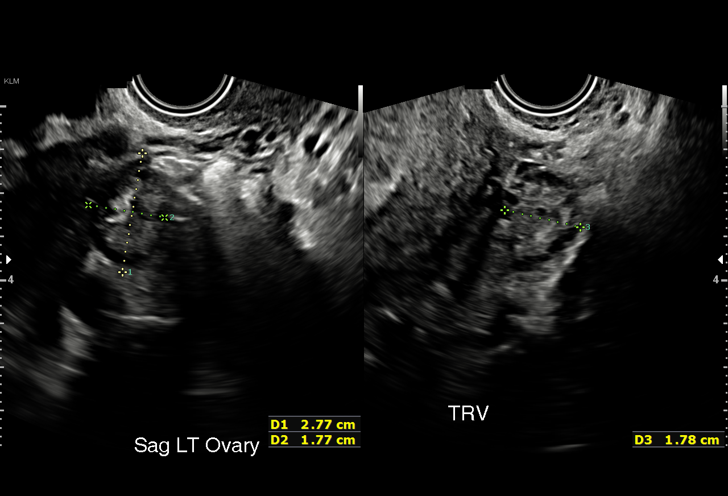
[im 15/16]
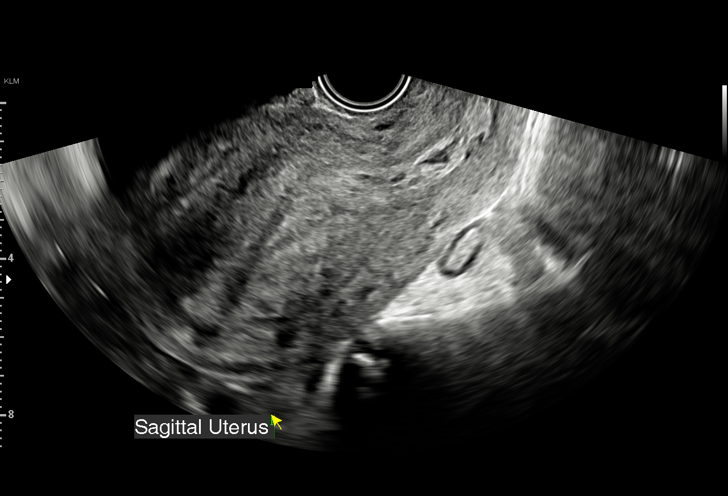
[im 16/16]
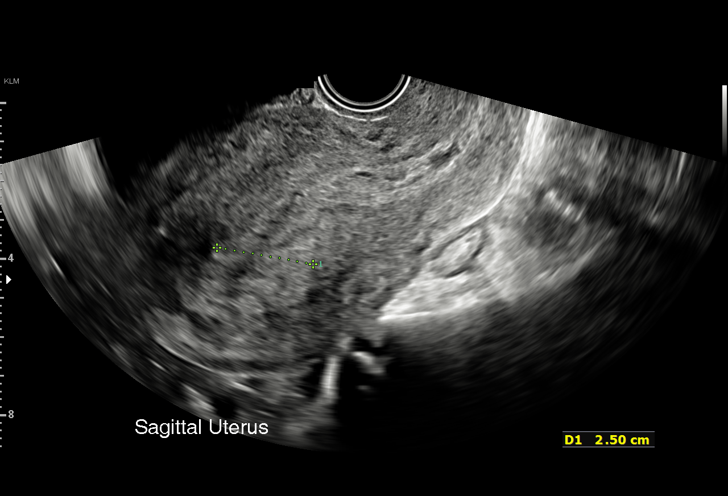

[15 of 16 positions shown; findings below may reference images not displayed]

FINDINGS: Intrauterine gestational sac: None

Maternal uterus/adnexae: Previously noted intrauterine gestational
sac has been evacuated. Small amount of avascular debris is seen
within the endometrial cavity within the lower uterine segment. The
endometrium appears thickened, measuring roughly 25 mm in thickness,
in keeping with recent just station. No intrauterine masses are
seen. The cervix is unremarkable. The maternal ovaries are normal in
size and echogenicity. No adnexal masses are seen. There is no free
fluid within the pelvis.
IMPRESSION: Interval complete abortion. Small amount of debris within the
endometrial cavity and thickening of the endometrium likely
represents the residua of recent gestation. No vascularized retained
products of conception.

## 2022-07-09 ENCOUNTER — Encounter (HOSPITAL_COMMUNITY): Payer: Self-pay | Admitting: Emergency Medicine

## 2022-07-09 ENCOUNTER — Ambulatory Visit (HOSPITAL_COMMUNITY)
Admission: EM | Admit: 2022-07-09 | Discharge: 2022-07-09 | Disposition: A | Discharge status: Home / Self Care | Payer: Medicaid Other | Attending: Emergency Medicine | Admitting: Emergency Medicine

## 2022-07-09 DIAGNOSIS — N949 Unspecified condition associated with female genital organs and menstrual cycle: Secondary | ICD-10-CM

## 2022-07-09 DIAGNOSIS — Z3202 Encounter for pregnancy test, result negative: Secondary | ICD-10-CM | POA: Insufficient documentation

## 2022-07-09 DIAGNOSIS — Z113 Encounter for screening for infections with a predominantly sexual mode of transmission: Secondary | ICD-10-CM | POA: Insufficient documentation

## 2022-07-09 LAB — POCT URINALYSIS DIP (MANUAL ENTRY)
Bilirubin, UA: NEGATIVE
Blood, UA: NEGATIVE
Glucose, UA: NEGATIVE mg/dL
Ketones, POC UA: NEGATIVE mg/dL
Nitrite, UA: NEGATIVE
Protein Ur, POC: NEGATIVE mg/dL
Spec Grav, UA: 1.02 (ref 1.010–1.025)
Urobilinogen, UA: 1 E.U./dL
pH, UA: 7.5 (ref 5.0–8.0)

## 2022-07-09 LAB — RPR: RPR Ser Ql: NONREACTIVE

## 2022-07-09 LAB — POCT URINE PREGNANCY: Preg Test, Ur: NEGATIVE

## 2022-07-09 NOTE — Discharge Instructions (Addendum)
Your urine pregnancy test was negative.  We have screened you for sexually transmitted infections as well as bacterial vaginosis and yeast.  We will contact you if anything results is positive and start the appropriate treatment.  Please abstain from intercourse until all results are received.  To help with for any vaginal irritation I recommend wearing cotton panties.  You can also take an over-the-counter probiotic for women's health with the active ingredient of lactobacillus, this helps balance vaginal pH and prevent against yeast and bacterial overgrowth.  Please return to clinic if you develop any new or concerning symptoms.

## 2022-07-09 NOTE — ED Provider Notes (Signed)
[facility]    CSN: [id number] Arrival date & time: 07/09/22  1033      History   Chief Complaint Chief Complaint  Patient presents with   SEXUALLY TRANSMITTED DISEASE    HPI Allison Robles is a 27 y.o. female.   Patient presents to clinic for a pregnancy test and sexually-transmitted infection screening.  She reports she has a new partner and would like to be screened.  Also needs clearance so she can donate plasma.  Denies any vaginal sores, abnormal vaginal discharge or lesions.  Denies dysuria, flank pain or fevers.  Her last menstrual cycle was the beginning of April, has irregular menses, would like to be screened for pregnancy.  She reports that sometimes when she wears underwear her perineum gets irritated.   The history is provided by the patient and medical records.    Past Medical History:  Diagnosis Date   Anemia    Bacterial meningitis    as newborn, has hearing loss in left ear   Medical history non-contributory     Patient Active Problem List   Diagnosis Date Noted   SAB (spontaneous abortion) 01/06/2020   Chronic migraine without aura without status migrainosus, not intractable 01/06/2020   Short interval between pregnancies affecting pregnancy, antepartum 10/05/2019   Alpha thalassemia silent carrier 01/14/2019    Past Surgical History:  Procedure Laterality Date   NO PAST SURGERIES      OB History     Gravida  2   Para  1   Term  1   Preterm  0   AB  1   Living  1      SAB  1   IAB  0   Ectopic  0   Multiple  0   Live Births  1            Home Medications    Prior to Admission medications   Medication Sig Start Date End Date Taking? Authorizing Provider  Blood Pressure Monitoring DEVI 1 Device by Does not apply route once a week. 12/20/18   Nugent, Odie Sera, NP    Family History Family History  Problem Relation Age of Onset   Hypertension Mother     Social History Social History   Tobacco  Use   Smoking status: Former    Types: Cigars    Quit date: 09/26/2018    Years since quitting: 3.7   Smokeless tobacco: Never   Tobacco comments:    Pt smokes 3 black and milds a day  Vaping Use   Vaping Use: Never used  Substance Use Topics   Alcohol use: No   Drug use: Yes    Types: Marijuana    Comment: Last use September 2021     Allergies   Patient has no known allergies.   Review of Systems Review of Systems  Constitutional:  Negative for chills and fever.  Genitourinary:  Positive for menstrual problem. Negative for difficulty urinating, dysuria, flank pain, genital sores, hematuria, vaginal bleeding and vaginal discharge.     Physical Exam Triage Vital Signs ED Triage Vitals  Enc Vitals Group     BP 07/09/22 1049 100/64     Pulse Rate 07/09/22 1049 74     Resp 07/09/22 1049 14     Temp 07/09/22 1049 98.6 F (37 C)     Temp Source 07/09/22 1049 Oral     SpO2 07/09/22 1049 99 %     Weight --  Height --      Head Circumference --      Peak Flow --      Pain Score 07/09/22 1047 0     Pain Loc --      Pain Edu? --      Excl. in GC? --    No data found.  Updated Vital Signs BP 100/64 (BP Location: Left Arm)   Pulse 74   Temp 98.6 F (37 C) (Oral)   Resp 14   SpO2 99%   Visual Acuity Right Eye Distance:   Left Eye Distance:   Bilateral Distance:    Right Eye Near:   Left Eye Near:    Bilateral Near:     Physical Exam Vitals and nursing note reviewed.  Constitutional:      Appearance: Normal appearance.  HENT:     Head: Normocephalic and atraumatic.     Right Ear: External ear normal.     Left Ear: External ear normal.     Nose: Nose normal.     Mouth/Throat:     Mouth: Mucous membranes are moist.  Eyes:     Conjunctiva/sclera: Conjunctivae normal.  Cardiovascular:     Rate and Rhythm: Normal rate.  Pulmonary:     Effort: Pulmonary effort is normal. No respiratory distress.  Skin:    General: Skin is warm and dry.     Capillary  Refill: Capillary refill takes less than 2 seconds.  Neurological:     General: No focal deficit present.     Mental Status: She is alert and oriented to person, place, and time.  Psychiatric:        Mood and Affect: Mood normal.      UC Treatments / Results  Labs (all labs ordered are listed, but only abnormal results are displayed) Labs Reviewed  POCT URINALYSIS DIP (MANUAL ENTRY) - Abnormal; Notable for the following components:      Result Value   Leukocytes, UA Small (1+) (*)    All other components within normal limits  RPR  HIV ANTIBODY (ROUTINE TESTING W REFLEX)  POCT URINE PREGNANCY  CERVICOVAGINAL ANCILLARY ONLY    EKG   Radiology No results found.  Procedures Procedures (including critical care time)  Medications Ordered in UC Medications - No data to display  Initial Impression / Assessment and Plan / UC Course  I have reviewed the triage vital signs and the nursing notes.  Pertinent labs & imaging results that were available during my care of the patient were reviewed by me and considered in my medical decision making (see chart for details).  Vitals and triage reviewed, patient is hemodynamically stable.  Urinalysis with trace leuks, urine pregnancy negative.  Without dysuria or flank pain, low concern for urinary tract infection, will withhold culture.  Cytology swab as well as HIV and syphilis screening obtained, will contact patient if anything results is abnormal.  Plan of care, follow-up care, and return precautions discussed, no questions at this time.    Final Clinical Impressions(s) / UC Diagnoses   Final diagnoses:  Negative pregnancy test  Screening examination for sexually transmitted disease     Discharge Instructions      Your urine pregnancy test was negative.  We have screened you for sexually transmitted infections as well as bacterial vaginosis and yeast.  We will contact you if anything results is positive and start the  appropriate treatment.  Please abstain from intercourse until all results are received.  To help with  for any vaginal irritation I recommend wearing cotton panties.  You can also take an over-the-counter probiotic for women's health with the active ingredient of lactobacillus, this helps balance vaginal pH and prevent against yeast and bacterial overgrowth.  Please return to clinic if you develop any new or concerning symptoms.      ED Prescriptions   None    PDMP not reviewed this encounter.   Dominque Marlin, [place], [place] 07/09/22 1122

## 2022-07-09 NOTE — ED Triage Notes (Signed)
Pt wanting STD testing and pregnancy test. Reports with new partner. Denies exposure or s/s of STD. Reports LMP beginning of April.

## 2022-07-10 ENCOUNTER — Telehealth (HOSPITAL_COMMUNITY): Payer: Self-pay | Admitting: Emergency Medicine

## 2022-07-10 LAB — CERVICOVAGINAL ANCILLARY ONLY
Bacterial Vaginitis (gardnerella): POSITIVE — AB
Candida Glabrata: NEGATIVE
Candida Vaginitis: NEGATIVE
Chlamydia: POSITIVE — AB
Comment: NEGATIVE
Comment: NEGATIVE
Comment: NEGATIVE
Comment: NEGATIVE
Comment: NEGATIVE
Comment: NORMAL
Neisseria Gonorrhea: NEGATIVE
Trichomonas: POSITIVE — AB

## 2022-07-10 LAB — MISC LABCORP TEST (SEND OUT): Labcorp test code: 83935

## 2022-07-10 MED ORDER — FLUCONAZOLE 150 MG PO TABS: 150.0000 mg | ORAL_TABLET | Freq: Once | ORAL | 0 refills | Status: AC

## 2022-07-10 MED ORDER — METRONIDAZOLE 500 MG PO TABS: 500.0000 mg | ORAL_TABLET | Freq: Two times a day (BID) | ORAL | 0 refills | Status: DC

## 2022-07-11 ENCOUNTER — Telehealth (HOSPITAL_COMMUNITY): Payer: Self-pay | Admitting: Emergency Medicine

## 2022-07-11 MED ORDER — DOXYCYCLINE HYCLATE 100 MG PO CAPS: 100.0000 mg | ORAL_CAPSULE | Freq: Two times a day (BID) | ORAL | 0 refills | Status: AC

## 2023-01-28 ENCOUNTER — Emergency Department (HOSPITAL_BASED_OUTPATIENT_CLINIC_OR_DEPARTMENT_OTHER)
Admission: EM | Admit: 2023-01-28 | Discharge: 2023-01-28 | Disposition: A | Discharge status: Home / Self Care | Payer: Medicaid Other | Attending: Emergency Medicine | Admitting: Emergency Medicine

## 2023-01-28 DIAGNOSIS — N390 Urinary tract infection, site not specified: Secondary | ICD-10-CM | POA: Diagnosis not present

## 2023-01-28 DIAGNOSIS — R109 Unspecified abdominal pain: Secondary | ICD-10-CM | POA: Diagnosis present

## 2023-01-28 LAB — CBC
HCT: 39.8 % (ref 36.0–46.0)
Hemoglobin: 12.3 g/dL (ref 12.0–15.0)
MCH: 24.2 pg — ABNORMAL LOW (ref 26.0–34.0)
MCHC: 30.9 g/dL (ref 30.0–36.0)
MCV: 78.3 fL — ABNORMAL LOW (ref 80.0–100.0)
Platelets: 316 10*3/uL (ref 150–400)
RBC: 5.08 MIL/uL (ref 3.87–5.11)
RDW: 15.9 % — ABNORMAL HIGH (ref 11.5–15.5)
WBC: 7.1 10*3/uL (ref 4.0–10.5)
nRBC: 0 % (ref 0.0–0.2)

## 2023-01-28 LAB — URINALYSIS, ROUTINE W REFLEX MICROSCOPIC
Bacteria, UA: NONE SEEN
Bilirubin Urine: NEGATIVE
Glucose, UA: >1000 mg/dL — AB
Ketones, ur: NEGATIVE mg/dL
Nitrite: POSITIVE — AB
Specific Gravity, Urine: 1.019 (ref 1.005–1.030)
pH: 6 (ref 5.0–8.0)

## 2023-01-28 LAB — COMPREHENSIVE METABOLIC PANEL
ALT: 16 U/L (ref 0–44)
AST: 18 U/L (ref 15–41)
Albumin: 4.5 g/dL (ref 3.5–5.0)
Alkaline Phosphatase: 75 U/L (ref 38–126)
Anion gap: 7 (ref 5–15)
BUN: 11 mg/dL (ref 6–20)
CO2: 27 mmol/L (ref 22–32)
Calcium: 9.9 mg/dL (ref 8.9–10.3)
Chloride: 104 mmol/L (ref 98–111)
Creatinine, Ser: 0.76 mg/dL (ref 0.44–1.00)
GFR, Estimated: >60 mL/min (ref >60)
Glucose, Bld: 83 mg/dL (ref 70–99)
Potassium: 3.6 mmol/L (ref 3.5–5.1)
Sodium: 138 mmol/L (ref 135–145)
Total Bilirubin: 0.6 mg/dL (ref <1.2)
Total Protein: 8.1 g/dL (ref 6.5–8.1)

## 2023-01-28 LAB — PREGNANCY, URINE: Preg Test, Ur: NEGATIVE

## 2023-01-28 LAB — LIPASE, BLOOD: Lipase: 88 U/L — ABNORMAL HIGH (ref 11–51)

## 2023-01-28 MED ORDER — CEPHALEXIN 500 MG PO CAPS: 500.0000 mg | ORAL_CAPSULE | Freq: Three times a day (TID) | ORAL | 0 refills | Status: DC

## 2023-01-28 MED ORDER — CEPHALEXIN 250 MG PO CAPS
500.0000 mg | ORAL_CAPSULE | Freq: Once | ORAL | Status: AC
Start: 2023-01-28 — End: 2023-01-28
  Administered 2023-01-28: 500 mg via ORAL
  Filled 2023-01-28: qty 2

## 2023-01-28 NOTE — ED Provider Notes (Signed)
[place] EMERGENCY DEPARTMENT AT [facility] Provider Note   CSN: [id number] Arrival date & time: 01/28/23  1854     History Chief Complaint  Patient presents with   Abdominal Pain    HPI Allison Robles is a 27 y.o. female presenting for occasional abdominal pain over the last 2 weeks worsening over the past few days.  Denies fevers chills nausea vomiting shortness of breath.  Is having urinary frequency otherwise no complaints..   Patient's recorded medical, surgical, social, medication list and allergies were reviewed in the Snapshot window as part of the initial history.   Review of Systems   Review of Systems  Constitutional:  Negative for chills and fever.  HENT:  Negative for ear pain and sore throat.   Eyes:  Negative for pain and visual disturbance.  Respiratory:  Negative for cough and shortness of breath.   Cardiovascular:  Negative for chest pain and palpitations.  Gastrointestinal:  Negative for abdominal pain and vomiting.  Genitourinary:  Positive for frequency. Negative for dysuria and hematuria.  Musculoskeletal:  Negative for arthralgias and back pain.  Skin:  Negative for color change and rash.  Neurological:  Negative for seizures and syncope.  All other systems reviewed and are negative.   Physical Exam Updated Vital Signs BP 128/78   Pulse 73   Temp 97.8 F (36.6 C) (Oral)   Resp 16   Ht 5\' 7"  (1.702 m)   Wt 54.4 kg   SpO2 100%   BMI 18.79 kg/m  Physical Exam Vitals and nursing note reviewed.  Constitutional:      General: She is not in acute distress.    Appearance: She is well-developed.  HENT:     Head: Normocephalic and atraumatic.  Eyes:     Conjunctiva/sclera: Conjunctivae normal.  Cardiovascular:     Rate and Rhythm: Normal rate and regular rhythm.     Heart sounds: No murmur heard. Pulmonary:     Effort: Pulmonary effort is normal. No respiratory distress.     Breath sounds: Normal breath sounds.  Abdominal:      General: There is no distension.     Palpations: Abdomen is soft.     Tenderness: There is no abdominal tenderness. There is no right CVA tenderness or left CVA tenderness.  Musculoskeletal:        General: No swelling or tenderness. Normal range of motion.     Cervical back: Neck supple.  Skin:    General: Skin is warm and dry.  Neurological:     General: No focal deficit present.     Mental Status: She is alert and oriented to person, place, and time. Mental status is at baseline.     Cranial Nerves: No cranial nerve deficit.      ED Course/ Medical Decision Making/ A&P    Procedures Procedures   Medications Ordered in ED Medications  cephALEXin (KEFLEX) capsule 500 mg (has no administration in time range)    Medical Decision Making:   Subacute abdominal pain very benign evaluation here currently not tender.  Vital signs reassuring.  Lab work to evaluate for metabolic or hematologic pathology performed grossly reassuring.  Interesting her urine has nitrate area and glucosuria as well as leukocyturia without visible bacteria.  Given the urinary frequency I am uncertain if this is secondary to the glucosuria or nitrate area representing infection.  She does not have systemically elevated glucose which is an interesting finding given the glucosuria.  Recommend she follow-up with the  PCP for A1c testing further screening for diabetes as appropriate but given the constellation of symptoms, will treat as UTI with Keflex 3 times daily 5 days with follow-up with PCP. Currently not consistent with pyelonephritis.  Exam not consistent with appendicitis, cholecystitis or small bowel obstruction.. Disposition:  I have considered need for hospitalization, however, considering all of the above, I believe this patient is stable for discharge at this time.  Patient/family educated about specific return precautions for given chief complaint and symptoms.  Patient/family educated about follow-up  with PCP.     Patient/family expressed understanding of return precautions and need for follow-up. Patient spoken to regarding all imaging and laboratory results and appropriate follow up for these results. All education provided in verbal form with additional information in written form. Time was allowed for answering of patient questions. Patient discharged.    Emergency Department Medication Summary:   Medications  cephALEXin (KEFLEX) capsule 500 mg (has no administration in time range)        Clinical Impression:  1. Urinary tract infection without hematuria, site unspecified      Discharge   Final Clinical Impression(s) / ED Diagnoses Final diagnoses:  Urinary tract infection without hematuria, site unspecified    Rx / DC Orders ED Discharge Orders          Ordered    cephALEXin (KEFLEX) 500 MG capsule  3 times daily        01/28/23 2217              Glyn Ade, MD 01/28/23 2223

## 2023-01-28 NOTE — ED Triage Notes (Signed)
Lower abd pain and nauseated. 2 weeks. Afebrile. Unsure if pregnant.

## 2023-02-25 NOTE — L&D Delivery Note (Signed)
 Patient complete and pushing. SVD of viable female infant over intact perineum. Nuchal cord x1 . Infant delivered to mom's abdomen. Delayed cord clamping x 1 minute. Cord clamped x 2, cut. Spontaneous cry heard. Weight and Apgars pending. Cord blood obtained. Placenta delivered spontaneously and intact. Vagina inspected.no lacerations noted.  EBL: 60cc Anesthesia: none

## 2023-07-20 ENCOUNTER — Encounter (HOSPITAL_COMMUNITY): Payer: Self-pay

## 2023-07-20 ENCOUNTER — Emergency Department (HOSPITAL_COMMUNITY)

## 2023-07-20 ENCOUNTER — Emergency Department (HOSPITAL_COMMUNITY)
Admission: EM | Admit: 2023-07-20 | Discharge: 2023-07-20 | Disposition: A | Discharge status: Home / Self Care | Attending: Emergency Medicine | Admitting: Emergency Medicine

## 2023-07-20 DIAGNOSIS — G919 Hydrocephalus, unspecified: Secondary | ICD-10-CM | POA: Diagnosis not present

## 2023-07-20 DIAGNOSIS — O208 Other hemorrhage in early pregnancy: Secondary | ICD-10-CM | POA: Diagnosis not present

## 2023-07-20 DIAGNOSIS — O9A211 Injury, poisoning and certain other consequences of external causes complicating pregnancy, first trimester: Secondary | ICD-10-CM | POA: Diagnosis present

## 2023-07-20 DIAGNOSIS — Y92410 Unspecified street and highway as the place of occurrence of the external cause: Secondary | ICD-10-CM | POA: Insufficient documentation

## 2023-07-20 DIAGNOSIS — Z3A11 11 weeks gestation of pregnancy: Secondary | ICD-10-CM | POA: Insufficient documentation

## 2023-07-20 LAB — COMPREHENSIVE METABOLIC PANEL WITH GFR
ALT: 17 U/L (ref 0–44)
AST: 32 U/L (ref 15–41)
Albumin: 3.9 g/dL (ref 3.5–5.0)
Alkaline Phosphatase: 45 U/L (ref 38–126)
Anion gap: 12 (ref 5–15)
BUN: 5 mg/dL — ABNORMAL LOW (ref 6–20)
CO2: 18 mmol/L — ABNORMAL LOW (ref 22–32)
Calcium: 9.5 mg/dL (ref 8.9–10.3)
Chloride: 108 mmol/L (ref 98–111)
Creatinine, Ser: 0.73 mg/dL (ref 0.44–1.00)
GFR, Estimated: >60 mL/min (ref >60)
Glucose, Bld: 72 mg/dL (ref 70–99)
Potassium: 3.7 mmol/L (ref 3.5–5.1)
Sodium: 138 mmol/L (ref 135–145)
Total Bilirubin: 0.8 mg/dL (ref 0.0–1.2)
Total Protein: 8 g/dL (ref 6.5–8.1)

## 2023-07-20 LAB — CBC WITH DIFFERENTIAL/PLATELET
Abs Immature Granulocytes: 0.07 10*3/uL (ref 0.00–0.07)
Basophils Absolute: 0 10*3/uL (ref 0.0–0.1)
Basophils Relative: 1 %
Eosinophils Absolute: 0.1 10*3/uL (ref 0.0–0.5)
Eosinophils Relative: 1 %
HCT: 43.4 % (ref 36.0–46.0)
Hemoglobin: 14 g/dL (ref 12.0–15.0)
Immature Granulocytes: 1 %
Lymphocytes Relative: 15 %
Lymphs Abs: 1.3 10*3/uL (ref 0.7–4.0)
MCH: 25.9 pg — ABNORMAL LOW (ref 26.0–34.0)
MCHC: 32.3 g/dL (ref 30.0–36.0)
MCV: 80.2 fL (ref 80.0–100.0)
Monocytes Absolute: 0.6 10*3/uL (ref 0.1–1.0)
Monocytes Relative: 6 %
Neutro Abs: 6.7 10*3/uL (ref 1.7–7.7)
Neutrophils Relative %: 76 %
Platelets: 387 10*3/uL (ref 150–400)
RBC: 5.41 MIL/uL — ABNORMAL HIGH (ref 3.87–5.11)
RDW: 15.1 % (ref 11.5–15.5)
WBC: 8.8 10*3/uL (ref 4.0–10.5)
nRBC: 0 % (ref 0.0–0.2)

## 2023-07-20 LAB — HCG, QUANTITATIVE, PREGNANCY: hCG, Beta Chain, Quant, S: 129054 m[IU]/mL — ABNORMAL HIGH (ref <5)

## 2023-07-20 MED ORDER — ONDANSETRON HCL 4 MG/2ML IJ SOLN
4.0000 mg | Freq: Once | INTRAMUSCULAR | Status: AC
  Administered 2023-07-20: 4 mg via INTRAVENOUS
  Filled 2023-07-20: qty 2
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
## 2023-07-22 ENCOUNTER — Inpatient Hospital Stay (HOSPITAL_COMMUNITY)
Admission: AD | Admit: 2023-07-22 | Discharge: 2023-07-22 | Disposition: A | Discharge status: Home / Self Care | Attending: Obstetrics & Gynecology | Admitting: Obstetrics & Gynecology

## 2023-07-22 ENCOUNTER — Encounter (HOSPITAL_COMMUNITY): Payer: Self-pay | Admitting: Obstetrics & Gynecology

## 2023-07-22 ENCOUNTER — Ambulatory Visit: Admitting: Family Medicine

## 2023-07-22 DIAGNOSIS — O26891 Other specified pregnancy related conditions, first trimester: Secondary | ICD-10-CM | POA: Diagnosis present

## 2023-07-22 DIAGNOSIS — O208 Other hemorrhage in early pregnancy: Secondary | ICD-10-CM | POA: Diagnosis present

## 2023-07-22 DIAGNOSIS — N939 Abnormal uterine and vaginal bleeding, unspecified: Secondary | ICD-10-CM

## 2023-07-22 DIAGNOSIS — Z3A11 11 weeks gestation of pregnancy: Secondary | ICD-10-CM

## 2023-07-22 DIAGNOSIS — O418X10 Other specified disorders of amniotic fluid and membranes, first trimester, not applicable or unspecified: Secondary | ICD-10-CM

## 2023-07-22 DIAGNOSIS — O23591 Infection of other part of genital tract in pregnancy, first trimester: Secondary | ICD-10-CM | POA: Diagnosis not present

## 2023-07-22 DIAGNOSIS — B9689 Other specified bacterial agents as the cause of diseases classified elsewhere: Secondary | ICD-10-CM | POA: Diagnosis not present

## 2023-07-22 DIAGNOSIS — O468X1 Other antepartum hemorrhage, first trimester: Secondary | ICD-10-CM

## 2023-07-22 DIAGNOSIS — R109 Unspecified abdominal pain: Secondary | ICD-10-CM | POA: Diagnosis not present

## 2023-07-22 LAB — URINALYSIS, ROUTINE W REFLEX MICROSCOPIC
Bilirubin Urine: NEGATIVE
Glucose, UA: 50 mg/dL — AB
Ketones, ur: NEGATIVE mg/dL
Leukocytes,Ua: NEGATIVE
Nitrite: NEGATIVE
Protein, ur: NEGATIVE mg/dL
Specific Gravity, Urine: 1.023 (ref 1.005–1.030)
pH: 6 (ref 5.0–8.0)

## 2023-07-22 LAB — WET PREP, GENITAL
Sperm: NONE SEEN
Trich, Wet Prep: NONE SEEN
WBC, Wet Prep HPF POC: >=10 — AB (ref <10)
Yeast Wet Prep HPF POC: NONE SEEN

## 2023-07-22 MED ORDER — ACETAMINOPHEN 500 MG PO TABS
1000.0000 mg | ORAL_TABLET | Freq: Once | ORAL | Status: AC
  Administered 2023-07-22: 1000 mg via ORAL
  Filled 2023-07-22: qty 2

## 2023-07-22 MED ORDER — METRONIDAZOLE 500 MG PO TABS: 500.0000 mg | ORAL_TABLET | Freq: Two times a day (BID) | ORAL | 0 refills | Status: DC

## 2023-07-22 MED ORDER — CYCLOBENZAPRINE HCL 5 MG PO TABS
10.0000 mg | ORAL_TABLET | Freq: Once | ORAL | Status: AC
Start: 2023-07-22 — End: 2023-07-22
  Administered 2023-07-22: 10 mg via ORAL
  Filled 2023-07-22: qty 2
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
## 2023-07-28 ENCOUNTER — Telehealth: Payer: Self-pay | Admitting: Family Medicine

## 2023-07-28 NOTE — Telephone Encounter (Signed)
 Attempted to reach patient to schedule an appointment to establish care. Left a voice message for her to call the office if she would like to schedule.

## 2023-07-28 NOTE — Telephone Encounter (Signed)
-----   Message from Corie Diamond sent at 07/22/2023 11:05 AM EDT ----- Regarding: NOB appt This patient was seen in MAU and previously was seen at the office on [address]. Planning to continue care with [facility].   Can we get her a NOB if possible?   Maurie Southern, CNM

## 2023-09-15 ENCOUNTER — Ambulatory Visit

## 2023-09-22 ENCOUNTER — Telehealth

## 2023-09-22 DIAGNOSIS — Z3A20 20 weeks gestation of pregnancy: Secondary | ICD-10-CM | POA: Diagnosis not present

## 2023-09-22 DIAGNOSIS — Z8619 Personal history of other infectious and parasitic diseases: Secondary | ICD-10-CM

## 2023-09-22 DIAGNOSIS — Z3480 Encounter for supervision of other normal pregnancy, unspecified trimester: Secondary | ICD-10-CM | POA: Insufficient documentation

## 2023-09-22 DIAGNOSIS — Z3482 Encounter for supervision of other normal pregnancy, second trimester: Secondary | ICD-10-CM

## 2023-09-22 NOTE — Progress Notes (Signed)
 New OB Intake  I connected with Allison Robles  on 09/22/23 at  1:15 PM EDT by MyChart Video Visit and verified that I am speaking with the correct person using two identifiers. Nurse is located at [facility] and pt is located at home.  I discussed the limitations, risks, security and privacy concerns of performing an evaluation and management service by telephone and the availability of in person appointments. I also discussed with the patient that there may be a patient responsible charge related to this service. The patient expressed understanding and agreed to proceed.  I explained I am completing New OB Intake today. We discussed EDD of 02/08/2024 based on US  at 11 weeks. Pt is G3P1011. I reviewed her allergies, medications and Medical/Surgical/OB history.    Patient Active Problem List   Diagnosis Date Noted   Supervision of other normal pregnancy, antepartum 09/22/2023   Alpha thalassemia silent carrier 01/14/2019     Concerns addressed today: -May 2025 positive for Syphillis, treated in June 2025 [facility]   Delivery Plans Plans to deliver at [facility]. Discussed the nature of our practice with multiple providers including residents and students. Due to the size of the practice, the delivering provider may not be the same as those providing prenatal care.   Patient is not interested in water  birth.  MyChart/Babyscripts MyChart access verified. I explained pt will have some visits in office and some virtually. Babyscripts instructions given and order placed. Patient verifies receipt of registration text/e-mail. Account successfully created and app downloaded. If patient is a candidate for Optimized scheduling, add to sticky note.   Blood Pressure Cuff/Weight Scale Blood pressure cuff ordered for patient to pick-up from [facility]. Explained after first prenatal appt pt will check weekly and document in Babyscripts. Patient does not have weight scale; order sent to [facility], patient may track weight weekly in Babyscripts.  Anatomy US  Explained first scheduled US  will be around 19 weeks. Anatomy US  scheduled for 11/06/2023 at 1:00PM.  Is patient a CenteringPregnancy candidate?  Declined Declined due to Declined to say   Is patient a Mom+Baby Combined Care candidate?  Not a candidate   If accepted, confirm patient does not intend to move from the area for at least 12 months, then notify Mom+Baby staff  Is patient a candidate for Babyscripts Optimization? Yes, patient declined   First visit review I reviewed new OB appt with patient. Explained pt will be seen by [facility] FNP at first visit. Discussed [place] genetic screening with patient. Yes Panorama and Horizon .SABRA Routine prenatal labs is not   Last Pap Diagnosis  Date Value Ref Range Status  12/20/2018   Final   - Negative for intraepithelial lesion or malignancy (NILM)    Allison Robles, CMA 09/22/2023  1:45 PM

## 2023-10-12 ENCOUNTER — Ambulatory Visit (INDEPENDENT_AMBULATORY_CARE_PROVIDER_SITE_OTHER): Payer: Self-pay | Admitting: Obstetrics and Gynecology

## 2023-10-12 ENCOUNTER — Other Ambulatory Visit (HOSPITAL_COMMUNITY)
Admission: RE | Admit: 2023-10-12 | Discharge: 2023-10-12 | Disposition: A | Discharge status: Home / Self Care | Source: Ambulatory Visit | Attending: Obstetrics and Gynecology | Admitting: Obstetrics and Gynecology

## 2023-10-12 ENCOUNTER — Encounter: Payer: Self-pay | Admitting: Obstetrics and Gynecology

## 2023-10-12 ENCOUNTER — Other Ambulatory Visit: Payer: Self-pay

## 2023-10-12 VITALS — BP 121/85 | HR 103 | Wt 126.4 lb

## 2023-10-12 DIAGNOSIS — O0992 Supervision of high risk pregnancy, unspecified, second trimester: Secondary | ICD-10-CM | POA: Diagnosis not present

## 2023-10-12 DIAGNOSIS — Z3A23 23 weeks gestation of pregnancy: Secondary | ICD-10-CM | POA: Diagnosis not present

## 2023-10-12 DIAGNOSIS — O98112 Syphilis complicating pregnancy, second trimester: Secondary | ICD-10-CM | POA: Diagnosis not present

## 2023-10-12 DIAGNOSIS — Z3480 Encounter for supervision of other normal pregnancy, unspecified trimester: Secondary | ICD-10-CM | POA: Insufficient documentation

## 2023-10-12 DIAGNOSIS — Z1332 Encounter for screening for maternal depression: Secondary | ICD-10-CM | POA: Diagnosis not present

## 2023-10-12 DIAGNOSIS — O98119 Syphilis complicating pregnancy, unspecified trimester: Secondary | ICD-10-CM | POA: Insufficient documentation

## 2023-10-12 MED ORDER — PENICILLIN G BENZATHINE 1200000 UNIT/2ML IM SUSY
2.4000 10*6.[IU] | PREFILLED_SYRINGE | Freq: Once | INTRAMUSCULAR | Status: AC
  Administered 2023-10-12: 2.4 10*6.[IU] via INTRAMUSCULAR

## 2023-10-12 NOTE — Progress Notes (Signed)
 INITIAL PRENATAL VISIT  Subjective:   Allison Robles is being seen today for her first obstetrical visit.   She is at 110w0d gestation by ultrasound.  Patient is planning to give guardianship to family member in [place] , already has something in place Pregnancy history fully reviewed.  Patient reports intermittent pelvic pressure.  Indications for ASA therapy (per uptodate) Two or more of the following: Nulliparity No Obesity (body mass index >30 kg/m2) No Family history of preeclampsia in mother or sister No Age >=35 years No Sociodemographic characteristics (African American race, low socioeconomic level) Yes Personal risk factors (eg, previous pregnancy with low birth weight or small for gestational age infant, previous adverse pregnancy outcome [eg, stillbirth], interval >10 years between pregnancies) No  Objective:    Obstetric History OB History  Gravida Para Term Preterm AB Living  3 1 1  0 1 1  SAB IAB Ectopic Multiple Live Births  1 0 0 0 1    # Outcome Date GA Lbr Len/2nd Weight Sex Type Anes PTL Lv  3 Current           2 SAB 11/11/19 [redacted]w[redacted]d         1 Term 04/27/19 [redacted]w[redacted]d 17:44 / 00:34 7 lb 5.3 oz (3.325 kg) M Vag-Spont EPI  LIV    Past Medical History:  Diagnosis Date   Anemia    Bacterial meningitis    as newborn, has hearing loss in left ear   Medical history non-contributory     Past Surgical History:  Procedure Laterality Date   NO PAST SURGERIES      Current Outpatient Medications on File Prior to Visit  Medication Sig Dispense Refill   Blood Pressure Monitoring DEVI 1 Device by Does not apply route once a week. 1 Device 0   metroNIDAZOLE  (FLAGYL ) 500 MG tablet Take 1 tablet (500 mg total) by mouth 2 (two) times daily. (Patient not taking: Reported on 09/22/2023) 14 tablet 0   No current facility-administered medications on file prior to visit.    No Known Allergies  Social History:  reports that she quit smoking about 5 years ago. Her smoking  use included cigars. She has never used smokeless tobacco. She reports that she does not currently use drugs after having used the following drugs: Marijuana. She reports that she does not drink alcohol.  Family History  Problem Relation Age of Onset   Hypertension Mother     The following portions of the patient's history were reviewed and updated as appropriate: allergies, current medications, past family history, past medical history, past social history, past surgical history and problem list.  Review of Systems Review of Systems  All other systems reviewed and are negative.   Physical Exam:  BP 121/85   Pulse (!) 103   Wt 126 lb 6.4 oz (57.3 kg)   BMI 19.80 kg/m  CONSTITUTIONAL: Well-developed, well-nourished female in no acute distress.  HENT:  Normocephalic, atraumatic.   SKIN: Skin is warm and dry. MUSCULOSKELETAL: Normal range of motion NEUROLOGIC: Alert and oriented  PSYCHIATRIC: Normal mood and affect. Normal behavior.  RESPIRATORY: normal effort ABDOMEN: Soft PELVIC: Pelvic: normal appearing vulva with no masses, tenderness or lesions  VAGINA: normal appearing vagina with normal color and discharge, no lesions  CERVIX: normal appearing cervix without discharge or lesions, no CMT  Thin prep pap is done    Extremities:  No swelling or varicosities noted   Assessment:    Pregnancy: G3P1011  1. Supervision of other normal pregnancy,  antepartum (Primary)  - Culture, OB Urine - CBC/D/Plt+RPR+Rh+ABO+RubIgG... - Hemoglobin A1c - Cytology - PAP - PANORAMA PRENATAL TEST - penicillin  g benzathine (BICILLIN  LA) 1200000 UNIT/2ML injection 2.4 Million Units  2. [redacted] weeks gestation of pregnancy Anticipatory guidance regarding GTT and labs next visit, discussed NPO status after midnight  Has plan in place for guardianship with family member, plan to see prenatal navigator today   3. Syphilis in pregnancy  She cannot recall name of facility in [place]. Neg RPR  07/09/2022 in chart, recent postive result in June, was instructed to do 3 doses PCN, receive 2/3 and did not receive third dose. 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 FIrst dose today      Plan:     Initial labs drawn. Prenatal vitamins. Problem list reviewed and updated. Reviewed in detail the nature of the practice with collaborative care between  Genetic screening discussed: NIPS/First trimester screen/Quad/AFP ordered. Role of ultrasound in pregnancy discussed; Anatomy US : scheduled. Follow up in 4 weeks. Weight gain recommendations per IOM guidelines reviewed: underweight/BMI 18.5 or less > 28 - 40 lbs; normal weight/BMI 18.5 - 24.9 > 25 - 35 lbs; overweight/BMI 25 - 29.9 > 15 - 25 lbs; obese/BMI  30 or more > 11 - 20 lbs.  Discussed clinic routines, schedule of care and testing, genetic screening options, involvement of students and residents under the direct supervision of APPs and doctors and presence of female providers. Pt verbalized understanding. Future Appointments  Date Time Provider Department Center  10/20/2023 10:20 AM [facility] NURSE [facility]  10/28/2023 10:20 AM [facility] NURSE [facility]  11/02/2023  8:15 AM Izell Harari, MD [facility]  11/02/2023  8:40 AM [facility] LAB [facility]  11/06/2023  1:00 PM [facility] PROVIDER 1 [facility]  11/06/2023  1:30 PM [facility] US5 [facility]  11/24/2023  9:15 AM Warren-Hill, Camie LABOR, CNM [facility]     Delores Nidia CROME, FNP

## 2023-10-13 LAB — CBC/D/PLT+RPR+RH+ABO+RUBIGG...
Antibody Screen: NEGATIVE
Basophils Absolute: 0 x10E3/uL (ref 0.0–0.2)
Basos: 0 %
EOS (ABSOLUTE): 0.3 x10E3/uL (ref 0.0–0.4)
Eos: 3 %
HCV Ab: NONREACTIVE
HIV Screen 4th Generation wRfx: NONREACTIVE
Hematocrit: 31.7 % — ABNORMAL LOW (ref 34.0–46.6)
Hemoglobin: 9.6 g/dL — ABNORMAL LOW (ref 11.1–15.9)
Hepatitis B Surface Ag: NEGATIVE
Immature Grans (Abs): 0.1 x10E3/uL (ref 0.0–0.1)
Immature Granulocytes: 1 %
Lymphocytes Absolute: 1.5 x10E3/uL (ref 0.7–3.1)
Lymphs: 17 %
MCH: 24.9 pg — ABNORMAL LOW (ref 26.6–33.0)
MCHC: 30.3 g/dL — ABNORMAL LOW (ref 31.5–35.7)
MCV: 82 fL (ref 79–97)
Monocytes Absolute: 0.8 x10E3/uL (ref 0.1–0.9)
Monocytes: 10 %
Neutrophils Absolute: 6 x10E3/uL (ref 1.4–7.0)
Neutrophils: 69 %
Platelets: 298 x10E3/uL (ref 150–450)
RBC: 3.85 x10E6/uL (ref 3.77–5.28)
RDW: 15.4 % (ref 11.7–15.4)
RPR Ser Ql: NONREACTIVE
Rh Factor: POSITIVE
Rubella Antibodies, IGG: 7.36 {index} (ref >0.99)
WBC: 8.6 x10E3/uL (ref 3.4–10.8)

## 2023-10-13 LAB — HEMOGLOBIN A1C
Est. average glucose Bld gHb Est-mCnc: 105 mg/dL
Hgb A1c MFr Bld: 5.3 % (ref 4.8–5.6)

## 2023-10-13 LAB — HCV INTERPRETATION

## 2023-10-14 ENCOUNTER — Ambulatory Visit: Payer: Self-pay | Admitting: Obstetrics and Gynecology

## 2023-10-14 DIAGNOSIS — Z3480 Encounter for supervision of other normal pregnancy, unspecified trimester: Secondary | ICD-10-CM

## 2023-10-14 DIAGNOSIS — O99012 Anemia complicating pregnancy, second trimester: Secondary | ICD-10-CM

## 2023-10-14 LAB — CULTURE, OB URINE

## 2023-10-14 LAB — URINE CULTURE, OB REFLEX: Organism ID, Bacteria: NO GROWTH

## 2023-10-14 LAB — CYTOLOGY - PAP
Chlamydia: NEGATIVE
Comment: NEGATIVE
Comment: NORMAL
Diagnosis: NEGATIVE
Neisseria Gonorrhea: NEGATIVE

## 2023-10-14 MED ORDER — ACCRUFER 30 MG PO CAPS: ORAL_CAPSULE | ORAL | 3 refills | Status: DC

## 2023-10-15 ENCOUNTER — Telehealth: Payer: Self-pay | Admitting: *Deleted

## 2023-10-15 NOTE — Telephone Encounter (Signed)
 Patient called stating that she has been trying to reach us  and to please contact her.  She left the phone number listed on her mychart.

## 2023-10-15 NOTE — Telephone Encounter (Signed)
 Call can't be completed-DL 1/78/74$[id number]$$[id number]$ :33

## 2023-10-16 ENCOUNTER — Other Ambulatory Visit: Payer: Self-pay | Admitting: Obstetrics and Gynecology

## 2023-10-16 MED ORDER — FERROUS SULFATE 325 (65 FE) MG PO TABS: 325.0000 mg | ORAL_TABLET | Freq: Every day | ORAL | 1 refills | Status: AC

## 2023-10-17 LAB — PANORAMA PRENATAL TEST FULL PANEL:PANORAMA TEST PLUS 5 ADDITIONAL MICRODELETIONS: FETAL FRACTION: 11.7

## 2023-10-20 ENCOUNTER — Other Ambulatory Visit: Payer: Self-pay

## 2023-10-20 ENCOUNTER — Ambulatory Visit

## 2023-10-20 VITALS — BP 120/57 | HR 84 | Ht 67.0 in | Wt 124.2 lb

## 2023-10-20 DIAGNOSIS — O98112 Syphilis complicating pregnancy, second trimester: Secondary | ICD-10-CM | POA: Diagnosis not present

## 2023-10-20 DIAGNOSIS — Z3A24 24 weeks gestation of pregnancy: Secondary | ICD-10-CM | POA: Diagnosis not present

## 2023-10-20 DIAGNOSIS — O98119 Syphilis complicating pregnancy, unspecified trimester: Secondary | ICD-10-CM

## 2023-10-20 MED ORDER — PENICILLIN G BENZATHINE 1200000 UNIT/2ML IM SUSY
2.4000 10*6.[IU] | PREFILLED_SYRINGE | Freq: Once | INTRAMUSCULAR | Status: AC
  Administered 2023-10-20: 2.4 10*6.[IU] via INTRAMUSCULAR

## 2023-10-20 NOTE — Progress Notes (Signed)
 Avarey Turi here for Bicillin  injection.  Injection administered without complication. Patient will return in one week for next injection. Pt denies any further questions or concerns for today.   Cyndee JAYSON Molt, RN 10/20/2023  8:31 AM

## 2023-10-21 ENCOUNTER — Other Ambulatory Visit: Payer: Self-pay | Admitting: *Deleted

## 2023-10-21 DIAGNOSIS — Z3480 Encounter for supervision of other normal pregnancy, unspecified trimester: Secondary | ICD-10-CM

## 2023-10-21 MED ORDER — PRENATAL 27-1 MG PO TABS: 1.0000 | ORAL_TABLET | Freq: Every day | ORAL | 11 refills | Status: AC

## 2023-10-28 ENCOUNTER — Ambulatory Visit

## 2023-10-28 ENCOUNTER — Telehealth: Payer: Self-pay | Admitting: *Deleted

## 2023-10-28 NOTE — Telephone Encounter (Signed)
 I called Allison Robles and informed her we are cancelling her appointment today for her bicillin  injection because we are out of bacillin and it is on backorder. I explained we are working with the health department to get her scheduled there. She voices understanding. Rock Skip PEAK

## 2023-10-29 NOTE — Progress Notes (Unsigned)
 Contacted [facility] to schedule pt appt for 3rd dose of Bicillin .  I was advised to fax information on pt and that they will reach out to the pt to schedule.  Once pt has been treated the office will receive medication

## 2023-11-01 ENCOUNTER — Other Ambulatory Visit: Payer: Self-pay | Admitting: Medical Genetics

## 2023-11-02 ENCOUNTER — Encounter: Payer: Self-pay | Admitting: Obstetrics and Gynecology

## 2023-11-02 ENCOUNTER — Ambulatory Visit (INDEPENDENT_AMBULATORY_CARE_PROVIDER_SITE_OTHER): Admitting: Obstetrics and Gynecology

## 2023-11-02 ENCOUNTER — Other Ambulatory Visit

## 2023-11-02 ENCOUNTER — Other Ambulatory Visit: Payer: Self-pay

## 2023-11-02 VITALS — BP 118/65 | HR 86 | Wt 132.5 lb

## 2023-11-02 DIAGNOSIS — Z3480 Encounter for supervision of other normal pregnancy, unspecified trimester: Secondary | ICD-10-CM

## 2023-11-02 DIAGNOSIS — Z3A26 26 weeks gestation of pregnancy: Secondary | ICD-10-CM | POA: Diagnosis not present

## 2023-11-02 DIAGNOSIS — O98112 Syphilis complicating pregnancy, second trimester: Secondary | ICD-10-CM | POA: Diagnosis not present

## 2023-11-02 DIAGNOSIS — O99011 Anemia complicating pregnancy, first trimester: Secondary | ICD-10-CM

## 2023-11-02 DIAGNOSIS — D563 Thalassemia minor: Secondary | ICD-10-CM

## 2023-11-02 DIAGNOSIS — O0973 Supervision of high risk pregnancy due to social problems, third trimester: Secondary | ICD-10-CM | POA: Insufficient documentation

## 2023-11-02 DIAGNOSIS — D509 Iron deficiency anemia, unspecified: Secondary | ICD-10-CM | POA: Insufficient documentation

## 2023-11-02 DIAGNOSIS — Z23 Encounter for immunization: Secondary | ICD-10-CM

## 2023-11-02 DIAGNOSIS — O99012 Anemia complicating pregnancy, second trimester: Secondary | ICD-10-CM

## 2023-11-02 MED ORDER — FAMOTIDINE 20 MG PO TABS
20.0000 mg | ORAL_TABLET | Freq: Two times a day (BID) | ORAL | 3 refills | Status: AC
Start: 2023-11-02 — End: ?

## 2023-11-02 NOTE — Progress Notes (Signed)
 Pt reports Acid Reflux

## 2023-11-02 NOTE — Progress Notes (Signed)
 PRENATAL VISIT NOTE  Subjective:  Allison Robles is a 28 y.o. G3P1011 at [redacted]w[redacted]d being seen today for ongoing prenatal care.  She is currently monitored for the following issues for this high-risk pregnancy and has Alpha thalassemia silent carrier; Supervision of other normal pregnancy, antepartum; Syphilis affecting pregnancy; Supervision of high risk pregnancy due to social problems in third trimester; and Anemia in pregnancy, first trimester on their problem list.  Patient reports heartburn.  Contractions: Not present. Vag. Bleeding: None.  Movement: Present. Denies leaking of fluid.   The following portions of the patient's history were reviewed and updated as appropriate: allergies, current medications, past family history, past medical history, past social history, past surgical history and problem list.   Objective:    Vitals:   11/02/23 0834  BP: 118/65  Pulse: 86  Weight: 132 lb 8 oz (60.1 kg)    Fetal Status:  Fetal Heart Rate (bpm): 153   Movement: Present    General: Alert, oriented and cooperative. Patient is in no acute distress.  Skin: Skin is warm and dry. No rash noted.   Cardiovascular: Normal heart rate noted  Respiratory: Normal respiratory effort, no problems with respiration noted  Abdomen: Soft, gravid, appropriate for gestational age.  Pain/Pressure: Present     Pelvic: Cervical exam deferred        Extremities: Normal range of motion.  Edema: None  Mental Status: Normal mood and affect. Normal behavior. Normal judgment and thought content.   Assessment and Plan:  Pregnancy: G3P1011 at [redacted]w[redacted]d 1. Alpha thalassemia silent carrier (Primary) - Anemia Profile B; Future  2. Anemia in pregnancy, first trimester - Anemia Profile B; Future  3. Supervision of other normal pregnancy, antepartum Unable to stay for 2h today. Will do labs next visit - Tdap vaccine greater than or equal to 7yo IM - Flu vaccine trivalent PF, 6mos and  older(Flulaval,Afluria,Fluarix,Fluzone) - Glucose Tolerance, 2 Hours w/1 Hour; Future - CBC; Future - RPR; Future - HIV Antibody (routine testing w rflx); Future  4. [redacted] weeks gestation of pregnancy Follow up re: birth control next visit - Glucose Tolerance, 2 Hours w/1 Hour; Future - CBC; Future - RPR; Future - HIV Antibody (routine testing w rflx); Future  5. Syphilis affecting pregnancy in second trimester States she went to [facility] 9[facility]5 because no doses of PCN here, [facility]. Will call and confirm with [facility].  [facility] 10/12[facility]23 10/12/23 new OB RPR at the [facility]: non-reactive  She cannot recall name of facility in [place]. Neg RPR 07/09/2022 in chart, recent postive result in June, was instructed to do 3 doses PCN, receive 2/3 and did not receive third dose. 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 Please refer to After Visit Summary for other counseling recommendations.   Return in about 2 weeks (around 11/16/2023) for 10-14d , low risk ob, in person, md or app, fasting 2hr GTT.  Future Appointments  Date Time Provider Department Center  11/06/2023  1:00 PM [facility] PROVIDER 1 [facility]  11/06/2023  1:30 PM [facility] US5 [facility]  11/24/2023  9:15 AM Warren-Hill, Camie LABOR, CNM [facility]    Bebe Furry, MD  ADDENDUM: Brooke RN at [facility] confirms patient got PCN 2.8m units on 9/4 and states report faxed to us  ATTN: Jeanetta.

## 2023-11-06 ENCOUNTER — Ambulatory Visit: Attending: Obstetrics and Gynecology

## 2023-11-06 ENCOUNTER — Other Ambulatory Visit

## 2023-11-06 ENCOUNTER — Ambulatory Visit: Admitting: Obstetrics and Gynecology

## 2023-11-06 VITALS — BP 110/55 | HR 92

## 2023-11-06 DIAGNOSIS — D251 Intramural leiomyoma of uterus: Secondary | ICD-10-CM | POA: Insufficient documentation

## 2023-11-06 DIAGNOSIS — O99012 Anemia complicating pregnancy, second trimester: Secondary | ICD-10-CM | POA: Insufficient documentation

## 2023-11-06 DIAGNOSIS — Z148 Genetic carrier of other disease: Secondary | ICD-10-CM

## 2023-11-06 DIAGNOSIS — O26892 Other specified pregnancy related conditions, second trimester: Secondary | ICD-10-CM | POA: Insufficient documentation

## 2023-11-06 DIAGNOSIS — Z3480 Encounter for supervision of other normal pregnancy, unspecified trimester: Secondary | ICD-10-CM | POA: Diagnosis present

## 2023-11-06 DIAGNOSIS — D563 Thalassemia minor: Secondary | ICD-10-CM | POA: Insufficient documentation

## 2023-11-06 DIAGNOSIS — O98112 Syphilis complicating pregnancy, second trimester: Secondary | ICD-10-CM | POA: Insufficient documentation

## 2023-11-06 DIAGNOSIS — O0973 Supervision of high risk pregnancy due to social problems, third trimester: Secondary | ICD-10-CM | POA: Diagnosis present

## 2023-11-06 DIAGNOSIS — A509 Congenital syphilis, unspecified: Secondary | ICD-10-CM | POA: Diagnosis not present

## 2023-11-06 DIAGNOSIS — O368220 Fetal anemia and thrombocytopenia, second trimester, not applicable or unspecified: Secondary | ICD-10-CM | POA: Insufficient documentation

## 2023-11-06 DIAGNOSIS — D259 Leiomyoma of uterus, unspecified: Secondary | ICD-10-CM | POA: Insufficient documentation

## 2023-11-06 DIAGNOSIS — O0932 Supervision of pregnancy with insufficient antenatal care, second trimester: Secondary | ICD-10-CM | POA: Insufficient documentation

## 2023-11-06 DIAGNOSIS — O3412 Maternal care for benign tumor of corpus uteri, second trimester: Secondary | ICD-10-CM | POA: Insufficient documentation

## 2023-11-06 DIAGNOSIS — O321XX0 Maternal care for breech presentation, not applicable or unspecified: Secondary | ICD-10-CM | POA: Diagnosis not present

## 2023-11-06 DIAGNOSIS — Z3A26 26 weeks gestation of pregnancy: Secondary | ICD-10-CM | POA: Diagnosis present

## 2023-11-06 DIAGNOSIS — Z36 Encounter for antenatal screening for chromosomal anomalies: Secondary | ICD-10-CM | POA: Diagnosis not present

## 2023-11-06 NOTE — Progress Notes (Signed)
 Maternal-Fetal Medicine Consultation  Name: Allison Robles  MRN: [id number]  GA: [id number] [redacted]w[redacted]d    Late prenatal care.  Patient is here for fetal anatomy scan. On cell-free fetal DNA screening, the risks of aneuploidies are not increased.   Patient has late syphilis and had 3 doses of benzathine penicillin , which she completed last week.  RPR results are pending.  She is a silent carrier for alpha thalassemia.  Obstetrical history significant for a term vaginal delivery in 2021 of a female infant weighing 7 pounds and 5 ounces at birth.  Her son is in good health.  Ultrasound We performed fetal anatomy scan. No makers of aneuploidies or fetal structural defects are seen. Fetal biometry is consistent with her previously-established dates. Amniotic fluid is normal and good fetal activity is seen.  No evidence of placentomegaly or hepatospenomegaly. No evidence of fetal hydrops.  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  Patient understands the limitations of ultrasound in detecting fetal anomalies.   Syphilis in pregnancy (11/18/21) I discussed congenital syphilis that is increasing in the [place]  because of untreated or inadequately treated cases. Serological tests cannot distinguish between early latent and late latent syphilis.    I discussed congenital syphilis involving multiple organs. Ultrasound findings include hepatomegaly, placentomegaly, fetal anemia, polyhdramnios, and, in severe cases, hydrops fetalis. I reassured the patient of today's ultrasound findings. I informed her of the limitations of ultrasound in diagnosing congenital syphilis.  Efficacy of Benzathine Penicillin  G approaches 99.7% in treatment of maternal infection and 98.2% for preventing congenital syphilis (all stages of syphilis).   Treatment failures are more common in 1) early syphilis, 2) if ultrasound abnormalities are present, 3) if initiated late in pregnancy.  It is difficult to diagnose the  reinfection. However, if four-fold increase in RPR is seen, one can assume reinfection.  Alpha Thalassemia-Silent Carrier I discussed the genetics and autosomal recessive mode of inheritance.  Briefly discussed the likelihood of variants of alpha thalassemia based on her partner's carrier status.  The pregnancy is not at risk if her partner is a silent carrier.  However, if her partner is a carrier in cis configuration (--/aa), there is a 25% chance for hemoglobin H disease.  I recommended partner screening and genetic counseling. Patient reports her partner is not involved in this pregnancy but will reach out to him for carrier screening.  Myomas -Myomas can undergo degenerations and cause severe abdominal pain that is managed conservatively with analgesics. Degeneration with symptoms commonly occur in the second trimester. -Myomas can also lead to preterm delivery, growth restriction or placental abruption. -Malpresentations, cesarean deliveries and postpaturm hemorrhages are more frequent.  -Myomas tend to regress after delivery.  Patient does not have abdominal pain.   Recommendations - An appointment was made for her to return in 6 weeks for fetal growth assessment and to evaluate the myomas.   Consultation including face-to-face (more than 50%) counseling 45 minutes.

## 2023-11-09 NOTE — Progress Notes (Unsigned)
 Contacted Darryle Spray, [facility], and she confirmed that the patient had her 3rd dose of Bicillin  on 10/29/23.

## 2023-11-12 ENCOUNTER — Encounter: Admitting: Obstetrics and Gynecology

## 2023-11-12 ENCOUNTER — Other Ambulatory Visit: Payer: Self-pay

## 2023-11-12 ENCOUNTER — Ambulatory Visit (INDEPENDENT_AMBULATORY_CARE_PROVIDER_SITE_OTHER): Admitting: Family Medicine

## 2023-11-12 ENCOUNTER — Encounter: Payer: Self-pay | Admitting: Family Medicine

## 2023-11-12 ENCOUNTER — Other Ambulatory Visit

## 2023-11-12 VITALS — BP 100/68 | HR 77 | Wt 137.8 lb

## 2023-11-12 DIAGNOSIS — O98112 Syphilis complicating pregnancy, second trimester: Secondary | ICD-10-CM | POA: Diagnosis not present

## 2023-11-12 DIAGNOSIS — Z3480 Encounter for supervision of other normal pregnancy, unspecified trimester: Secondary | ICD-10-CM

## 2023-11-12 DIAGNOSIS — D563 Thalassemia minor: Secondary | ICD-10-CM

## 2023-11-12 DIAGNOSIS — O99012 Anemia complicating pregnancy, second trimester: Secondary | ICD-10-CM | POA: Diagnosis not present

## 2023-11-12 DIAGNOSIS — O0972 Supervision of high risk pregnancy due to social problems, second trimester: Secondary | ICD-10-CM

## 2023-11-12 DIAGNOSIS — O0973 Supervision of high risk pregnancy due to social problems, third trimester: Secondary | ICD-10-CM

## 2023-11-12 DIAGNOSIS — O99011 Anemia complicating pregnancy, first trimester: Secondary | ICD-10-CM

## 2023-11-12 DIAGNOSIS — O98113 Syphilis complicating pregnancy, third trimester: Secondary | ICD-10-CM

## 2023-11-12 DIAGNOSIS — Z3A26 26 weeks gestation of pregnancy: Secondary | ICD-10-CM

## 2023-11-12 DIAGNOSIS — Z3A27 27 weeks gestation of pregnancy: Secondary | ICD-10-CM

## 2023-11-12 NOTE — Progress Notes (Signed)
   Subjective:  Allison Robles is a 28 y.o. G3P1011 at [redacted]w[redacted]d being seen today for ongoing prenatal care.  She is currently monitored for the following issues for this high-risk pregnancy and has Alpha thalassemia silent carrier; Syphilis affecting pregnancy; Supervision of high risk pregnancy due to social problems in third trimester; and Anemia in pregnancy, first trimester on their problem list.  Patient reports no complaints.  Contractions: Not present. Vag. Bleeding: None.  Movement: Present. Denies leaking of fluid.   The following portions of the patient's history were reviewed and updated as appropriate: allergies, current medications, past family history, past medical history, past social history, past surgical history and problem list. Problem list updated.  Objective:   Vitals:   11/12/23 0959  BP: 100/68  Pulse: 77  Weight: 137 lb 12.8 oz (62.5 kg)    Fetal Status: Fetal Heart Rate (bpm): 137   Movement: Present     General:  Alert, oriented and cooperative. Patient is in no acute distress.  Skin: Skin is warm and dry. No rash noted.   Cardiovascular: Normal heart rate noted  Respiratory: Normal respiratory effort, no problems with respiration noted  Abdomen: Soft, gravid, appropriate for gestational age. Pain/Pressure: Present     Pelvic: Vag. Bleeding: None     Cervical exam deferred        Extremities: Normal range of motion.  Edema: None  Mental Status: Normal mood and affect. Normal behavior. Normal judgment and thought content.   Urinalysis:      Assessment and Plan:  Pregnancy: G3P1011 at [redacted]w[redacted]d  1. Supervision of high risk pregnancy due to social problems in third trimester (Primary) BP and FHR normal Third trimester labs today Wondering about appetite pill, discussed doesn't exist and also not to worry about it as baby is growing normally on ultrasound Offered TDAP, reports she had it a month ago at [facility], asked to bring records to next  visit  2. Anemia in pregnancy, first trimester Lab Results  Component Value Date   HGB 9.6 (L) 10/12/2023  On oral iron, check anemia panel today - Anemia Profile B  3. Syphilis affecting pregnancy in third trimester Received bicillin  x2 at [facility] and then at last visit reported she received third dose at [facility], records received on 10/29/2023 confirming this and are in media tab  4. Alpha thalassemia silent carrier Asked to reach out to [facility] about testing after last visit, she is still trying to get in contact  Preterm labor symptoms and general obstetric precautions including but not limited to vaginal bleeding, contractions, leaking of fluid and fetal movement were reviewed in detail with the patient. Please refer to After Visit Summary for other counseling recommendations.  Return in about 2 weeks (around 11/26/2023) for [facility], ob visit.   Lola Donnice HERO, MD

## 2023-11-12 NOTE — Patient Instructions (Signed)

## 2023-11-13 ENCOUNTER — Ambulatory Visit: Payer: Self-pay | Admitting: Family Medicine

## 2023-11-13 LAB — ANEMIA PROFILE B
Basophils Absolute: 0 x10E3/uL (ref 0.0–0.2)
Basos: 0 %
EOS (ABSOLUTE): 0.3 x10E3/uL (ref 0.0–0.4)
Eos: 3 %
Ferritin: 18 ng/mL (ref 15–150)
Folate: >20 ng/mL (ref >3.0)
Hematocrit: 31.2 % — ABNORMAL LOW (ref 34.0–46.6)
Hemoglobin: 9.6 g/dL — ABNORMAL LOW (ref 11.1–15.9)
Immature Grans (Abs): 0.1 x10E3/uL (ref 0.0–0.1)
Immature Granulocytes: 1 %
Iron Saturation: 29 % (ref 15–55)
Iron: 126 ug/dL (ref 27–159)
Lymphocytes Absolute: 1.2 x10E3/uL (ref 0.7–3.1)
Lymphs: 13 %
MCH: 25.9 pg — ABNORMAL LOW (ref 26.6–33.0)
MCHC: 30.8 g/dL — ABNORMAL LOW (ref 31.5–35.7)
MCV: 84 fL (ref 79–97)
Monocytes Absolute: 0.9 x10E3/uL (ref 0.1–0.9)
Monocytes: 9 %
Neutrophils Absolute: 6.9 x10E3/uL (ref 1.4–7.0)
Neutrophils: 74 %
Platelets: 272 x10E3/uL (ref 150–450)
RBC: 3.71 x10E6/uL — ABNORMAL LOW (ref 3.77–5.28)
RDW: 16.7 % — ABNORMAL HIGH (ref 11.7–15.4)
Retic Ct Pct: 2.1 % (ref 0.6–2.6)
Total Iron Binding Capacity: 439 ug/dL (ref 250–450)
UIBC: 313 ug/dL (ref 131–425)
Vitamin B-12: 431 pg/mL (ref 232–1245)
WBC: 9.4 x10E3/uL (ref 3.4–10.8)

## 2023-11-13 LAB — CBC
Hematocrit: 31.3 % — ABNORMAL LOW (ref 34.0–46.6)
Hemoglobin: 9.5 g/dL — ABNORMAL LOW (ref 11.1–15.9)
MCH: 25.8 pg — ABNORMAL LOW (ref 26.6–33.0)
MCHC: 30.4 g/dL — ABNORMAL LOW (ref 31.5–35.7)
MCV: 85 fL (ref 79–97)
Platelets: 278 x10E3/uL (ref 150–450)
RBC: 3.68 x10E6/uL — ABNORMAL LOW (ref 3.77–5.28)
RDW: 16.6 % — ABNORMAL HIGH (ref 11.7–15.4)
WBC: 9.2 x10E3/uL (ref 3.4–10.8)

## 2023-11-13 LAB — GLUCOSE TOLERANCE, 2 HOURS W/ 1HR
Glucose, 1 hour: 81 mg/dL (ref 70–179)
Glucose, 2 hour: 58 mg/dL — ABNORMAL LOW (ref 70–152)
Glucose, Fasting: 57 mg/dL — ABNORMAL LOW (ref 70–91)

## 2023-11-13 LAB — RPR: RPR Ser Ql: NONREACTIVE

## 2023-11-13 LAB — HIV ANTIBODY (ROUTINE TESTING W REFLEX): HIV Screen 4th Generation wRfx: NONREACTIVE

## 2023-11-14 ENCOUNTER — Ambulatory Visit: Payer: Self-pay | Admitting: Obstetrics and Gynecology

## 2023-11-14 DIAGNOSIS — D509 Iron deficiency anemia, unspecified: Secondary | ICD-10-CM

## 2023-11-15 ENCOUNTER — Encounter: Payer: Self-pay | Admitting: Obstetrics and Gynecology

## 2023-11-16 ENCOUNTER — Telehealth: Payer: Self-pay

## 2023-11-16 NOTE — Telephone Encounter (Signed)
 Dr. Izell, patient will be scheduled as soon as possible.  Auth Submission: NO AUTH NEEDED Site of care: Site of care: [facility] Payer: [employer] [place] Medication & CPT/J Code(s) submitted: Venofer (Iron Sucrose) J1756 Diagnosis Code:  Route of submission (phone, fax, portal):  Phone # Fax # Auth type: Buy/Bill PB Units/visits requested: 300mg  x 3 doses Reference number:  Approval from: 11/16/23 to 02/24/24

## 2023-11-22 ENCOUNTER — Encounter (HOSPITAL_COMMUNITY): Payer: Self-pay | Admitting: Obstetrics and Gynecology

## 2023-11-22 ENCOUNTER — Inpatient Hospital Stay (HOSPITAL_COMMUNITY)
Admission: AD | Admit: 2023-11-22 | Discharge: 2023-11-22 | Disposition: A | Discharge status: Home / Self Care | Attending: Obstetrics and Gynecology | Admitting: Obstetrics and Gynecology

## 2023-11-22 ENCOUNTER — Other Ambulatory Visit: Payer: Self-pay

## 2023-11-22 DIAGNOSIS — R109 Unspecified abdominal pain: Secondary | ICD-10-CM | POA: Diagnosis not present

## 2023-11-22 DIAGNOSIS — O26899 Other specified pregnancy related conditions, unspecified trimester: Secondary | ICD-10-CM

## 2023-11-22 DIAGNOSIS — Z3A28 28 weeks gestation of pregnancy: Secondary | ICD-10-CM | POA: Diagnosis not present

## 2023-11-22 DIAGNOSIS — O26893 Other specified pregnancy related conditions, third trimester: Secondary | ICD-10-CM | POA: Diagnosis not present

## 2023-11-22 DIAGNOSIS — R102 Pelvic and perineal pain: Secondary | ICD-10-CM | POA: Diagnosis not present

## 2023-11-22 LAB — URINALYSIS, ROUTINE W REFLEX MICROSCOPIC
Bilirubin Urine: NEGATIVE
Glucose, UA: NEGATIVE mg/dL
Hgb urine dipstick: NEGATIVE
Ketones, ur: NEGATIVE mg/dL
Nitrite: NEGATIVE
Protein, ur: NEGATIVE mg/dL
Specific Gravity, Urine: 1.014 (ref 1.005–1.030)
pH: 7 (ref 5.0–8.0)

## 2023-11-22 MED ORDER — LACTATED RINGERS IV BOLUS
1000.0000 mL | Freq: Once | INTRAVENOUS | Status: AC
  Administered 2023-11-22: 1000 mL via INTRAVENOUS

## 2023-11-22 NOTE — MAU Note (Signed)
 Allison Robles is a 28 y.o. at [redacted]w[redacted]d here in MAU reporting: sharp bilateral abdominal pain since 5am today. Pain is bilateral and in her hips/groin and sharp in nature. Occurs with movement. Better with position changes. Absent at rest. Denies VB, LOF. Baby is active today. Has not tried anything at home. No other complaints.   Onset of complaint: 11/22/23 Pain score: denies at rest; 5/10 with movement. Vitals:   11/22/23 1104 11/22/23 1105  BP: 117/61   Pulse: 82   Resp: 18   Temp: 97.9 F (36.6 C)   SpO2:  100%     FHT: 130  Lab orders placed from triage: UA

## 2023-11-22 NOTE — MAU Provider Note (Signed)
 Chief Complaint:  Abdominal Pain   Event Date/Time   First Provider Initiated Contact with Patient 11/22/23 1126     HPI: Allison Robles is a 28 y.o. G3P1011 at [redacted]w[redacted]d who presents to maternity admissions reporting abdominal pain. Symptoms started this morning at 5 AM.  Reports some bilateral groin pain that occurs with movement and walking.  Also reports cramping with associated abdominal tightening that happens several times per hour.  Denies N/V/D, dysuria, vaginal bleeding, LOF, vaginal discharge.  No recent intercourse.  Reports good fetal movement.  Location: lower abdomen Quality: sharp & cramping Severity: 5/10 in pain scale Duration: 6 hours Timing: several times per hour Modifying factors: worse with movement Associated signs and symptoms: none   Past Medical History:  Diagnosis Date   Anemia    Bacterial meningitis    as newborn, has hearing loss in left ear   OB History  Gravida Para Term Preterm AB Living  3 1 1  0 1 1  SAB IAB Ectopic Multiple Live Births  1 0 0 0 1    # Outcome Date GA Lbr Len/2nd Weight Sex Type Anes PTL Lv  3 Current           2 SAB 11/11/19 [redacted]w[redacted]d         1 Term 04/27/19 [redacted]w[redacted]d 17:44 / 00:34 3325 g M Vag-Spont EPI  LIV   Past Surgical History:  Procedure Laterality Date   NO PAST SURGERIES     Family History  Problem Relation Age of Onset   Hypertension Mother    Social History   Tobacco Use   Smoking status: Former    Types: Cigars    Quit date: 09/26/2018    Years since quitting: 5.1   Smokeless tobacco: Never   Tobacco comments:    Pt smokes 3 black and milds a day  Vaping Use   Vaping status: Never Used  Substance Use Topics   Alcohol use: No   Drug use: Not Currently    Types: Marijuana    Comment: Last use September 2021   No Known Allergies No medications prior to admission.    I have reviewed patient's Past Medical Hx, Surgical Hx, Family Hx, Social Hx, medications and allergies.   ROS:  Review of Systems  All  other systems reviewed and are negative.   Physical Exam  Patient Vitals for the past 24 hrs:  BP Temp Temp src Pulse Resp SpO2 Height  11/22/23 1235 (!) 112/57 -- -- 74 18 100 % --  11/22/23 1114 117/68 -- -- 90 -- -- --  11/22/23 1113 117/68 -- -- 90 -- -- --  11/22/23 1105 -- -- -- -- -- 100 % --  11/22/23 1104 117/61 97.9 F (36.6 C) Oral 82 18 -- --  11/22/23 1055 -- -- -- -- -- -- 5' 7 (1.702 m)    Constitutional: Well-developed, well-nourished female in no acute distress.  Cardiovascular: normal rate & rhythm, no murmur Respiratory: normal effort, lung sounds clear throughout GI: Abd soft, non-tender, gravid appropriate for gestational age. Pos BS x 4 MS: Extremities nontender, no edema, normal ROM Neurologic: Alert and oriented x 4.  GU:      Pelvic: NEFG, physiologic discharge, no blood, cervix clean.   Dilation: Closed Effacement (%): Thick Cervical Position: Posterior Station: Ballotable Exam by:: Rocky Satterfield NP  Fetal Tracing:  Baseline: 135 Variability: moderate Accelerations: 10x10 Decelerations: none  Toco: UI    Labs: Results for orders placed or performed during the  hospital encounter of 11/22/23 (from the past 24 hours)  Urinalysis, Routine w reflex microscopic -Urine, Clean Catch     Status: Abnormal   Collection Time: 11/22/23 10:46 AM  Result Value Ref Range   Color, Urine YELLOW YELLOW   APPearance HAZY (A) CLEAR   Specific Gravity, Urine 1.014 1.005 - 1.030   pH 7.0 5.0 - 8.0   Glucose, UA NEGATIVE NEGATIVE mg/dL   Hgb urine dipstick NEGATIVE NEGATIVE   Bilirubin Urine NEGATIVE NEGATIVE   Ketones, ur NEGATIVE NEGATIVE mg/dL   Protein, ur NEGATIVE NEGATIVE mg/dL   Nitrite NEGATIVE NEGATIVE   Leukocytes,Ua TRACE (A) NEGATIVE   RBC / HPF 0-5 0 - 5 RBC/hpf   WBC, UA 0-5 0 - 5 WBC/hpf   Bacteria, UA RARE (A) NONE SEEN   Squamous Epithelial / HPF 0-5 0 - 5 /HPF   Mucus PRESENT     Imaging:  No results found.  MAU Course: Orders  Placed This Encounter  Procedures   Culture, OB Urine   Urinalysis, Routine w reflex microscopic -Urine, Clean Catch   Discharge patient Discharge disposition: 01-Home or Self Care; Discharge patient date: 11/22/2023   Meds ordered this encounter  Medications   lactated ringers  bolus 1,000 mL    MDM: Low  Patient with some irregular tightening (2x per hour) & UI on TOCO. Also reports symptoms consistent with round ligament pain.  Treated in MAU with IV fluid bolus. Reports resolution of symptom. Cervix closed/thick/ballotable.   Assessment: 1. Abdominal cramping affecting pregnancy   2. Pain of round ligament during pregnancy   3. [redacted] weeks gestation of pregnancy     Plan: Discharge home in stable condition.  Urine culture pending Reviewed PTL precautions Encourage maternity support belt   Allergies as of 11/22/2023   No Known Allergies      Medication List     TAKE these medications    Blood Pressure Monitoring Devi 1 Device by Does not apply route once a week.   famotidine  20 MG tablet Commonly known as: PEPCID  Take 1 tablet (20 mg total) by mouth 2 (two) times daily.   ferrous sulfate  325 (65 FE) MG tablet Take 1 tablet (325 mg total) by mouth daily with breakfast. Take 1 tablet (325mg ) by mouth every other day   Prenatal 27-1 MG Tabs Take 1 tablet by mouth daily.        Jerilynn Longs, NP 11/22/2023 1:43 PM

## 2023-11-22 NOTE — Discharge Instructions (Signed)
   PREGNANCY SUPPORT BELT: You are not alone, Seventy-five percent of women have some sort of abdominal or back pain at some point in their pregnancy. Your baby is growing at a fast pace, which means that your whole body is rapidly trying to adjust to the changes. As your uterus grows, your back may start feeling a bit under stress and this can result in back or abdominal pain that can go from mild, and therefore bearable, to severe pains that will not allow you to sit or lay down comfortably, When it comes to dealing with pregnancy-related pains and cramps, some pregnant women usually prefer natural remedies, which the market is filled with nowadays. For example, wearing a pregnancy support belt can help ease and lessen your discomfort and pain. WHAT ARE THE BENEFITS OF WEARING A PREGNANCY SUPPORT BELT? A pregnancy support belt provides support to the lower portion of the belly taking some of the weight of the growing uterus and distributing to the other parts of your body. It is designed make you comfortable and gives you extra support. Over the years, the pregnancy apparel market has been studying the needs and wants of pregnant women and they have come up with the most comfortable pregnancy support belts that woman could ever ask for. In fact, you will no longer have to wear a stretched-out or bulky pregnancy belt that is visible underneath your clothes and makes you feel even more uncomfortable. Nowadays, a pregnancy support belt is made of comfortable and stretchy materials that will not irritate your skin but will actually make you feel at ease and you will not even notice you are wearing it. They are easy to put on and adjust during the day and can be worn at night for additional support.  BENEFITS: Relives Back pain Relieves Abdominal Muscle and Leg Pain Stabilizes the Pelvic Ring Offers a Cushioned Abdominal Lift Pad Relieves pressure on the Sciatic Nerve Within Minutes    Locations that Carry  Maternity/Pregnancy Support Belts  You can find belts on [employer].com starting at $10-$15.  2.  The [facility] carries belts for $10-$15.        Address/Phone: [address], [address], [place], [place] [address], [phone]  3.  You may be able to file your insurance with [email or web address] and have a belt mailed to you.  If you have any problems getting the belt, let your [facility] office know.  

## 2023-11-23 LAB — CULTURE, OB URINE
Culture: NO GROWTH
Special Requests: NORMAL

## 2023-11-24 ENCOUNTER — Encounter: Admitting: Certified Nurse Midwife

## 2023-11-26 ENCOUNTER — Ambulatory Visit

## 2023-11-30 ENCOUNTER — Ambulatory Visit (INDEPENDENT_AMBULATORY_CARE_PROVIDER_SITE_OTHER): Admitting: Student

## 2023-11-30 ENCOUNTER — Other Ambulatory Visit: Payer: Self-pay

## 2023-11-30 VITALS — BP 114/72 | HR 81 | Wt 138.8 lb

## 2023-11-30 DIAGNOSIS — O0973 Supervision of high risk pregnancy due to social problems, third trimester: Secondary | ICD-10-CM | POA: Diagnosis not present

## 2023-11-30 DIAGNOSIS — D509 Iron deficiency anemia, unspecified: Secondary | ICD-10-CM

## 2023-11-30 DIAGNOSIS — O99013 Anemia complicating pregnancy, third trimester: Secondary | ICD-10-CM | POA: Diagnosis not present

## 2023-11-30 DIAGNOSIS — D563 Thalassemia minor: Secondary | ICD-10-CM | POA: Diagnosis not present

## 2023-11-30 DIAGNOSIS — Z3A30 30 weeks gestation of pregnancy: Secondary | ICD-10-CM

## 2023-12-02 NOTE — Progress Notes (Signed)
   PRENATAL VISIT NOTE  Subjective:  Allison Robles is a 28 y.o. G3P1011 at [redacted]w[redacted]d being seen today for ongoing prenatal care.  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  Patient reports no complaints.  Contractions: Irritability. Vag. Bleeding: None.  Movement: Present. Denies leaking of fluid.   The following portions of the patient's history were reviewed and updated as appropriate: allergies, current medications, past family history, past medical history, past social history, past surgical history and problem list.   Objective:    Vitals:   11/30/23 1605  BP: 114/72  Pulse: 81  Weight: 138 lb 12.8 oz (63 kg)    Fetal Status:  Fetal Heart Rate (bpm): 130   Movement: Present    General: Alert, oriented and cooperative. Patient is in no acute distress.  Skin: Skin is warm and dry. No rash noted.   Cardiovascular: Normal heart rate noted  Respiratory: Normal respiratory effort, no problems with respiration noted  Abdomen: Soft, gravid, appropriate for gestational age.  Pain/Pressure: Present     Pelvic: Cervical exam deferred        Extremities: Normal range of motion.  Edema: None  Mental Status: Normal mood and affect. Normal behavior. Normal judgment and thought content.   Assessment and Plan:  Pregnancy: G3P1011 at [redacted]w[redacted]d 1. Supervision of high risk pregnancy due to social problems in third trimester (Primary) - BP and FHT normal - Fetal movement is noted   2. [redacted] weeks gestation of pregnancy - continue routine prenatal care  3. Maternal iron deficiency anemia affecting pregnancy in third trimester, antepartum - IV Iron scheduled  4. Alpha thalassemia silent  carrier   Preterm labor symptoms and general obstetric precautions including but not limited to vaginal bleeding, contractions, leaking of fluid and fetal movement were reviewed in detail with the patient. Please refer to After Visit Summary for other counseling recommendations.   No follow-ups on file.  Future Appointments  Date Time Provider Department Center  12/07/2023  2:15 PM Delores Nidia CROME, FNP [facility]  12/18/2023 11:00 AM [facility] 4 [facility] None  12/21/2023  9:35 AM Eldonna Suzen Octave, MD [facility]  12/25/2023 11:00 AM [facility]IR 5 [facility] None  01/01/2024 11:00 AM [facility]IR 6 [facility] None  01/05/2024 10:15 AM Wallace Joesph LABOR, PA [facility]  01/11/2024 10:45 AM Vita Morrow, MD [facility] [address]  01/12/2024  1:35 PM Milly Olam LABOR, CNM [facility]  01/18/2024  9:35 AM Claudene, [place] , CNM [facility]  01/18/2024  3:00 PM WL-LAB GENECONNECT WL-MLABL None  01/25/2024 10:55 AM [facility]GENERAL 2 [facility]  02/01/2024 10:15 AM [facility]GENERAL 2 [facility]  02/08/2024 10:15 AM [facility]GENERAL 2 [facility]    Nat Dauer, NP

## 2023-12-03 ENCOUNTER — Ambulatory Visit

## 2023-12-07 ENCOUNTER — Ambulatory Visit: Admitting: Obstetrics and Gynecology

## 2023-12-07 VITALS — BP 116/62 | HR 80 | Wt 141.2 lb

## 2023-12-07 DIAGNOSIS — O99013 Anemia complicating pregnancy, third trimester: Secondary | ICD-10-CM | POA: Diagnosis not present

## 2023-12-07 DIAGNOSIS — O0973 Supervision of high risk pregnancy due to social problems, third trimester: Secondary | ICD-10-CM

## 2023-12-07 DIAGNOSIS — Z3A31 31 weeks gestation of pregnancy: Secondary | ICD-10-CM | POA: Diagnosis not present

## 2023-12-07 DIAGNOSIS — D509 Iron deficiency anemia, unspecified: Secondary | ICD-10-CM

## 2023-12-07 NOTE — Progress Notes (Signed)
   PRENATAL VISIT NOTE  Subjective:  Allison Robles is a 28 y.o. G3P1011 at [redacted]w[redacted]d being seen today for ongoing prenatal care.  She is currently monitored for the following issues for this low-risk pregnancy and has Alpha thalassemia silent carrier; Syphilis affecting pregnancy; Supervision of high risk pregnancy due to social problems in third trimester; and Maternal iron deficiency anemia affecting pregnancy in third trimester, antepartum on their problem list.  Patient reports no complaints.   . Vag. Bleeding: None.  Movement: Present. Denies leaking of fluid.   The following portions of the patient's history were reviewed and updated as appropriate: allergies, current medications, past family history, past medical history, past social history, past surgical history and problem list.   Objective:    Vitals:   12/07/23 1440  BP: 116/62  Pulse: 80  Weight: 141 lb 3.2 oz (64 kg)    Fetal Status:  Fetal Heart Rate (bpm): 128 Fundal Height: 31 cm Movement: Present    General: Alert, oriented and cooperative. Patient is in no acute distress.  Skin: Skin is warm and dry. No rash noted.   Cardiovascular: Normal heart rate noted  Respiratory: Normal respiratory effort, no problems with respiration noted  Abdomen: Soft, gravid, appropriate for gestational age.  Pain/Pressure: Present     Pelvic: Cervical exam deferred        Extremities: Normal range of motion.  Edema: None  Mental Status: Normal mood and affect. Normal behavior. Normal judgment and thought content.   Assessment and Plan:  Pregnancy: G3P1011 at [redacted]w[redacted]d 1. Supervision of high risk pregnancy due to social problems in third trimester (Primary) BP and FHR normal Doing well, feeling regular movement    2. [redacted] weeks gestation of pregnancy Bag provided today from market She is doing well, still planning guardianship with family and they have already completed paperwork   3. Maternal iron deficiency anemia affecting  pregnancy in third trimester, antepartum [place] oral iron, has iron infusion  10/24  Preterm labor symptoms and general obstetric precautions including but not limited to vaginal bleeding, contractions, leaking of fluid and fetal movement were reviewed in detail with the patient. Please refer to After Visit Summary for other counseling recommendations.     Future Appointments  Date Time Provider Department Center  12/18/2023 11:00 AM [facility]CHAIR 4 [facility] None  12/21/2023  9:35 AM Eldonna Suzen Octave, MD [facility]  12/25/2023 11:00 AM [facility]CHAIR 5 CH[facility]IN[facility] None  01/01/2024 11:00 AM [facility]CHAIR 6 [facility]IN[facility] None  01/05/2024 10:15 AM Wallace Joesph LABOR, PA [facility]  01/11/2024 10:45 AM Vita Morrow, MD [facility] [address]  01/12/2024  1:35 PM Milly Olam LABOR, CNM [facility]  01/18/2024  9:35 AM Claudene Shaper , CNM [facility]  01/18/2024  3:00 PM WL-LAB GENECONNECT WL-MLABL None  01/25/2024 10:55 AM Jerilynn Longs, NP [facility]  02/01/2024 10:15 AM [facility]-GENERAL 2 [facility]  02/08/2024 10:15 AM Cleatus Moccasin, MD [facility]    Nidia Daring, FNP

## 2023-12-17 ENCOUNTER — Ambulatory Visit

## 2023-12-18 ENCOUNTER — Ambulatory Visit (INDEPENDENT_AMBULATORY_CARE_PROVIDER_SITE_OTHER)

## 2023-12-18 VITALS — BP 92/60 | HR 82 | Temp 97.8°F | Resp 20 | Ht 67.0 in | Wt 142.6 lb

## 2023-12-18 DIAGNOSIS — D509 Iron deficiency anemia, unspecified: Secondary | ICD-10-CM

## 2023-12-18 DIAGNOSIS — Z3A32 32 weeks gestation of pregnancy: Secondary | ICD-10-CM

## 2023-12-18 DIAGNOSIS — O99013 Anemia complicating pregnancy, third trimester: Secondary | ICD-10-CM | POA: Diagnosis not present

## 2023-12-18 MED ORDER — SODIUM CHLORIDE 0.9 % IV SOLN
300.0000 mg | INTRAVENOUS | Status: DC
  Administered 2023-12-18: 300 mg via INTRAVENOUS
  Filled 2023-12-18: qty 15

## 2023-12-18 NOTE — Progress Notes (Signed)
 Diagnosis: Iron Deficiency Anemia  Provider:  Praveen Mannam MD  Procedure: IV Infusion  IV Type: Peripheral, IV Location: R Antecubital  Venofer (Iron Sucrose), Dose: 300 mg  Infusion Start Time: 1148  Infusion Stop Time: 1334  Post Infusion IV Care: Observation period completed and Peripheral IV Discontinued  Discharge: Condition: Good, Destination: Home . AVS Declined  Performed by:  Maximiano JONELLE Pouch, LPN

## 2023-12-21 ENCOUNTER — Ambulatory Visit (INDEPENDENT_AMBULATORY_CARE_PROVIDER_SITE_OTHER): Admitting: Family Medicine

## 2023-12-21 ENCOUNTER — Encounter: Payer: Self-pay | Admitting: Family Medicine

## 2023-12-21 VITALS — BP 112/75 | HR 81 | Wt 143.3 lb

## 2023-12-21 DIAGNOSIS — O99013 Anemia complicating pregnancy, third trimester: Secondary | ICD-10-CM | POA: Diagnosis not present

## 2023-12-21 DIAGNOSIS — O0973 Supervision of high risk pregnancy due to social problems, third trimester: Secondary | ICD-10-CM | POA: Diagnosis not present

## 2023-12-21 DIAGNOSIS — D509 Iron deficiency anemia, unspecified: Secondary | ICD-10-CM

## 2023-12-21 DIAGNOSIS — O98113 Syphilis complicating pregnancy, third trimester: Secondary | ICD-10-CM | POA: Diagnosis not present

## 2023-12-21 DIAGNOSIS — Z3A33 33 weeks gestation of pregnancy: Secondary | ICD-10-CM | POA: Diagnosis not present

## 2023-12-21 NOTE — Progress Notes (Unsigned)
   PRENATAL VISIT NOTE  Subjective:  Allison Robles is a 28 y.o. G3P1011 at [redacted]w[redacted]d being seen today for ongoing prenatal care.  She is currently monitored for the following issues for this low-risk pregnancy and has Alpha thalassemia silent carrier; Syphilis affecting pregnancy; Supervision of high risk pregnancy due to social problems in third trimester; and Maternal iron deficiency anemia affecting pregnancy in third trimester, antepartum on their problem list.  Patient reports no complaints.  Contractions: Irritability. Vag. Bleeding: None.  Movement: Present. Denies leaking of fluid.   The following portions of the patient's history were reviewed and updated as appropriate: allergies, current medications, past family history, past medical history, past social history, past surgical history and problem list.   Objective:    Vitals:   12/21/23 0922  BP: 112/75  Pulse: 81  Weight: 143 lb 4.8 oz (65 kg)    Fetal Status:  Fetal Heart Rate (bpm): 150   Movement: Present    General: Alert, oriented and cooperative. Patient is in no acute distress.  Skin: Skin is warm and dry. No rash noted.   Cardiovascular: Normal heart rate noted  Respiratory: Normal respiratory effort, no problems with respiration noted  Abdomen: Soft, gravid, appropriate for gestational age.  Pain/Pressure: Present     Pelvic: Cervical exam deferred        Extremities: Normal range of motion.  Edema: None  Mental Status: Normal mood and affect. Normal behavior. Normal judgment and thought content.   Assessment and Plan:  Pregnancy: G3P1011 at [redacted]w[redacted]d 1. Syphilis affecting pregnancy in third trimester (Primary) Received PCN x3  Repeat titer for full response/baseline in Feb  2. Supervision of high risk pregnancy due to social problems in third trimester Up to date Vigorous movement No concerns today  3. Maternal iron deficiency anemia affecting pregnancy in third trimester, antepartum Recommended FE  infusions-- is scheduled for them in late Oct/early Nov  4. [redacted] weeks gestation of pregnancy  Preterm labor symptoms and general obstetric precautions including but not limited to vaginal bleeding, contractions, leaking of fluid and fetal movement were reviewed in detail with the patient. Please refer to After Visit Summary for other counseling recommendations.   Return in about 2 weeks (around 01/04/2024) for Routine prenatal care.   Future Appointments  Date Time Provider Department Center  12/21/2023  9:35 AM Eldonna Suzen Octave, MD [facility]  12/25/2023 11:00 AM [facility]-CHAIR 5 CH[facility] None  01/01/2024 11:00 AM [facility]CHAIR 6 CH[facility]IN[facility] None  01/05/2024 10:15 AM Wallace Joesph LABOR, PA [facility]  01/11/2024 10:45 AM Vita Morrow, MD [facility] [address]  01/12/2024  1:35 PM Milly Olam LABOR, CNM [facility]  01/18/2024  9:35 AM Claudene Shaper , CNM [facility]  01/18/2024  3:00 PM WL-LAB GENECONNECT WL-MLABL None  01/25/2024 10:55 AM Jerilynn Longs, NP [facility]  02/01/2024 10:15 AM Izell Harari, MD [facility]  02/08/2024 10:15 AM Cleatus Moccasin, MD [facility]    Suzen Octave Eldonna, MD

## 2023-12-21 NOTE — Progress Notes (Signed)
 RN went into exam room to let pt know that the provider will be in shortly.  Pt had left the exam room.  Attempted to contact pt unable leave message as message stating please try your call again later.  Omega Slager,RN  12/21/23

## 2023-12-25 ENCOUNTER — Encounter (HOSPITAL_COMMUNITY): Payer: Self-pay | Admitting: Obstetrics and Gynecology

## 2023-12-25 ENCOUNTER — Inpatient Hospital Stay (HOSPITAL_COMMUNITY)
Admission: AD | Admit: 2023-12-25 | Discharge: 2023-12-25 | Disposition: A | Discharge status: Home / Self Care | Attending: Obstetrics and Gynecology | Admitting: Obstetrics and Gynecology

## 2023-12-25 ENCOUNTER — Ambulatory Visit

## 2023-12-25 VITALS — BP 108/70 | HR 88 | Temp 98.4°F | Resp 16 | Ht 67.0 in | Wt 145.6 lb

## 2023-12-25 DIAGNOSIS — O99012 Anemia complicating pregnancy, second trimester: Secondary | ICD-10-CM | POA: Diagnosis not present

## 2023-12-25 DIAGNOSIS — O26893 Other specified pregnancy related conditions, third trimester: Secondary | ICD-10-CM

## 2023-12-25 DIAGNOSIS — D509 Iron deficiency anemia, unspecified: Secondary | ICD-10-CM | POA: Diagnosis not present

## 2023-12-25 DIAGNOSIS — Z3A33 33 weeks gestation of pregnancy: Secondary | ICD-10-CM | POA: Diagnosis not present

## 2023-12-25 DIAGNOSIS — O99013 Anemia complicating pregnancy, third trimester: Secondary | ICD-10-CM | POA: Diagnosis not present

## 2023-12-25 DIAGNOSIS — O26899 Other specified pregnancy related conditions, unspecified trimester: Secondary | ICD-10-CM

## 2023-12-25 DIAGNOSIS — N949 Unspecified condition associated with female genital organs and menstrual cycle: Secondary | ICD-10-CM

## 2023-12-25 DIAGNOSIS — R109 Unspecified abdominal pain: Secondary | ICD-10-CM | POA: Diagnosis not present

## 2023-12-25 LAB — URINALYSIS, ROUTINE W REFLEX MICROSCOPIC
Bilirubin Urine: NEGATIVE
Glucose, UA: NEGATIVE mg/dL
Hgb urine dipstick: NEGATIVE
Ketones, ur: NEGATIVE mg/dL
Leukocytes,Ua: NEGATIVE
Nitrite: NEGATIVE
Protein, ur: NEGATIVE mg/dL
Specific Gravity, Urine: 1.01 (ref 1.005–1.030)
pH: 7 (ref 5.0–8.0)

## 2023-12-25 MED ORDER — LACTATED RINGERS IV BOLUS
1000.0000 mL | Freq: Once | INTRAVENOUS | Status: AC
  Administered 2023-12-25: 1000 mL via INTRAVENOUS

## 2023-12-25 MED ORDER — CYCLOBENZAPRINE HCL 5 MG PO TABS
10.0000 mg | ORAL_TABLET | Freq: Once | ORAL | Status: AC
  Administered 2023-12-25: 10 mg via ORAL
  Filled 2023-12-25: qty 2

## 2023-12-25 MED ORDER — ACETAMINOPHEN 500 MG PO TABS: 1000.0000 mg | ORAL_TABLET | Freq: Once | ORAL | Status: DC

## 2023-12-25 MED ORDER — SODIUM CHLORIDE 0.9 % IV SOLN
300.0000 mg | INTRAVENOUS | Status: DC
  Administered 2023-12-25: 300 mg via INTRAVENOUS
  Filled 2023-12-25: qty 15

## 2023-12-25 MED ORDER — CYCLOBENZAPRINE HCL 10 MG PO TABS: 10.0000 mg | ORAL_TABLET | Freq: Three times a day (TID) | ORAL | 1 refills | Status: AC | PRN

## 2023-12-25 NOTE — MAU Note (Signed)
 Allison Robles is a 28 y.o. at [redacted]w[redacted]d here in MAU reporting: increased pain and cramping since 1530. Denies any vag bleeding or discharge. Good fetal movement felt.   LMP:  Onset of complaint: 1530 Pain score: 8 Vitals:   12/25/23 1919  BP: 118/60  Pulse: 84  Resp: 18  Temp: 98.8 F (37.1 C)     FHT: 138  Lab orders placed from triage: u/a

## 2023-12-25 NOTE — Discharge Instructions (Signed)
 PELVIC/ABDOMINAL PAIN: Up to 80 percent of women experience lower abdomen/groin pain at some point during pregnancy, mostly in that final trimester when stress on the pelvic region is especially intense. Your increasingly heavy baby is burrowing deeper into your pelvis in preparation for birth, and their head is now pressing hard against your bladder, rectum, hips and pelvic bones. The result is an ever-increasing stress on the joints, muscles and organs in your pelvis and back. Soak in a warm tub (not too hot) Tylenol  1000 mg by mouth every 6-8 hrs as needed for pain Flexeril  as needed up to three times daily Slow position changes Laying on the affected side Wear a maternity support belt during the day. Take it off while sleeping. Apply a heating pad to your lower back for 20 minutes at a time, taking at least a 20-minute break before applying again. Massage: Starting at the middle of your pubic bone, trace little circles in a wide U from your pubic bone to your hip bones on both sides.  Then starting just above your pubic bone, press in and down, alternating sides to create a gentle rocking of your uterus back and forth.  Move your hands up the sides of your belly and back down. Do this 3-5 times upon waking and before bed.  Stretches: Get on hands and knees and alternate arching your back deeply while inhaling, and then rounding your back while exhaling. Modified runners lunge:  Sit on a chair with half of your bottom on the chair and half off.  Sit up tall, plant your front foot, and stretch your other foot out behind you.  Breathe deeply for 5 breaths and then do the other side. Chiropractor or massage therapy: [facility] in [place] specializes in pregnancy care. You can visit their website at: [email or web address] to schedule a new patient chiropractic treatment (1 hour) appointment. Please let us  know if you have any other questions or concerns.     Reasons to return to MAU at [facility]: Less than 36 weeks: Contractions feels like menstrual cramps. You should go to the hospital if you have more than 6 contractions in an hour, even after you have rested and drank at least 16 ounces of water .  More than 36 weeks: You begin to have strong, frequent contractions 5 minutes apart or less, each last 1 minute, these have been going on for 1-2 hours, and you cannot walk or talk during them. Your water  breaks.  Sometimes it is a big gush of fluid. However, many times it may it may be much more subtle. You should go to the hospital if you have a constant leakage of fluid from your vagina, enough to soak a pad when you are walking around.  You have vaginal bleeding.  It is normal to have a small amount of spotting if your cervix was checked. If you have bleeding requiring the use of a pad, go to the hospital. You don't feel your baby moving like normal.  If you think that you baby's movement is decreased, eat a snack and rest on your left side in a quiet room for one hour. If you have not felt the baby move more than 6 times in an hour GO TO THE HOSPITAL.

## 2023-12-25 NOTE — MAU Provider Note (Addendum)
 Chief Complaint:  Contractions and Abdominal Pain   HPI  Allison Robles is a 28 y.o. G3P1011 at [redacted]w[redacted]d w/PMHx of iron deficiency anemia requiring iron infusions who presents to maternity admissions reporting abdominal cramps since 1500 today. Patient states they are constant. She has tried resting and a heating pad but that has not helped. She endorses mild nausea. She denies vomiting, diarrhea, fevers, CP, SOB, LE edema, vaginal bleeding or discharge, recent intercourse, LOF, or any other complaints at this time. She did get an iron infusion earlier today around 1120. She was seen for the same symptoms 09/28 and given 1L of fluids which she states resolved symptoms completely.   Pregnancy course: Receives care at [facility]. Prenatal records reviewed.  Past Medical History:  Diagnosis Date   Anemia    Bacterial meningitis    as newborn, has hearing loss in left ear   OB History  Gravida Para Term Preterm AB Living  3 1 1  0 1 1  SAB IAB Ectopic Multiple Live Births  1 0 0 0 1    # Outcome Date GA Lbr Len/2nd Weight Sex Type Anes PTL Lv  3 Current           2 SAB 11/11/19 [redacted]w[redacted]d         1 Term 04/27/19 [redacted]w[redacted]d 17:44 / 00:34 3325 g M Vag-Spont EPI  LIV   Past Surgical History:  Procedure Laterality Date   NO PAST SURGERIES     Family History  Problem Relation Age of Onset   Hypertension Mother    Social History   Tobacco Use   Smoking status: Former    Types: Cigars    Quit date: 09/26/2018    Years since quitting: 5.2   Smokeless tobacco: Never   Tobacco comments:    Pt smokes 3 black and milds a day  Vaping Use   Vaping status: Never Used  Substance Use Topics   Alcohol use: No   Drug use: Not Currently    Types: Marijuana    Comment: Last use September 2021   No Known Allergies Medications Prior to Admission  Medication Sig Dispense Refill Last Dose/Taking   famotidine  (PEPCID ) 20 MG tablet Take 1 tablet (20 mg total) by mouth 2 (two) times daily. 60  tablet 3 12/24/2023   ferrous sulfate  325 (65 FE) MG tablet Take 1 tablet (325 mg total) by mouth daily with breakfast. Take 1 tablet (325mg ) by mouth every other day 90 tablet 1 12/24/2023   Prenatal 27-1 MG TABS Take 1 tablet by mouth daily. 30 tablet 11 12/25/2023   Blood Pressure Monitoring DEVI 1 Device by Does not apply route once a week. (Patient not taking: Reported on 12/21/2023) 1 Device 0     I have reviewed patient's Past Medical Hx, Surgical Hx, Family Hx, Social Hx, medications and allergies.   ROS  Pertinent items noted in HPI and remainder of comprehensive ROS otherwise negative.   PHYSICAL EXAM  Patient Vitals for the past 24 hrs:  BP Temp Pulse Resp Height Weight  12/25/23 1919 118/60 98.8 F (37.1 C) 84 18 5' 7 (1.702 m) 65.3 kg   Constitutional: Well-developed, well-nourished female in no acute distress.  Cardiovascular: normal rate & rhythm, warm and well-perfused Respiratory: normal effort, no problems with respiration noted GI: Abd soft, non-tender, non-distended MS: Extremities nontender, no edema, normal ROM Neurologic: Alert and oriented x 4.  GU: no CVA tenderness Pelvic exam: exam chaperoned by Kathrine Wilson RN.  Dilation: Closed Effacement (%): Thick Cervical Position: Posterior Station: Ballotable Exam by:: Joesph Sear PA-C  Fetal Tracing: Baseline: 130  Variability: present Accelerations: present Decelerations: none Toco: occasional uterine contractions    Labs: Results for orders placed or performed during the hospital encounter of 12/25/23 (from the past 24 hours)  Urinalysis, Routine w reflex microscopic -Urine, Clean Catch     Status: Abnormal   Collection Time: 12/25/23  7:20 PM  Result Value Ref Range   Color, Urine YELLOW YELLOW   APPearance HAZY (A) CLEAR   Specific Gravity, Urine 1.010 1.005 - 1.030   pH 7.0 5.0 - 8.0   Glucose, UA NEGATIVE NEGATIVE mg/dL   Hgb urine dipstick NEGATIVE NEGATIVE   Bilirubin Urine NEGATIVE  NEGATIVE   Ketones, ur NEGATIVE NEGATIVE mg/dL   Protein, ur NEGATIVE NEGATIVE mg/dL   Nitrite NEGATIVE NEGATIVE   Leukocytes,Ua NEGATIVE NEGATIVE    Imaging:  No results found.  MDM & MAU COURSE  MDM: low  MAU Course: Signed over to oncoming provider Joesph Sear, PA at 2011.  Camie Dixons, DO PGY-1 Family Medicine Resident [facility]  -UA and wet prep to rule out infection. -Fluids and Flexeril  for cramping. -Cervix closed. -Cramping improved after fluids and Flexeril .  Orders Placed This Encounter  Procedures   Wet prep, genital   Urinalysis, Routine w reflex microscopic -Urine, Clean Catch   Meds ordered this encounter  Medications   lactated ringers  bolus 1,000 mL   DISCONTD: acetaminophen  (TYLENOL ) tablet 1,000 mg   cyclobenzaprine  (FLEXERIL ) tablet 10 mg   cyclobenzaprine  (FLEXERIL ) 10 MG tablet    Sig: Take 1 tablet (10 mg total) by mouth 3 (three) times daily as needed for muscle spasms.    Dispense:  30 tablet    Refill:  1     ASSESSMENT   1. Abdominal pain affecting pregnancy   2. Round ligament pain   3. [redacted] weeks gestation of pregnancy     PLAN  Discharge home in stable condition with return precautions.  Tylenol  and Flexeril  for cramping. Encouraged adequate hydration. 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   Joesph DELENA Sear, PA

## 2023-12-25 NOTE — Progress Notes (Signed)
 Diagnosis: Iron Deficiency Anemia  Provider:  Praveen Mannam MD  Procedure: IV Infusion  IV Type: Peripheral, IV Location: R Forearm  Venofer (Iron Sucrose), Dose: 300 mg  Infusion Start Time: 1151  Infusion Stop Time: 1337  Post Infusion IV Care: Patient declined observation PIV Discontinued  Discharge: Condition: Good, Destination: Home . AVS Provided  Performed by:  Merdith Adan, RN

## 2023-12-28 LAB — GC/CHLAMYDIA PROBE AMP (~~LOC~~) NOT AT ARMC
Chlamydia: NEGATIVE
Comment: NEGATIVE
Comment: NORMAL
Neisseria Gonorrhea: NEGATIVE

## 2024-01-01 ENCOUNTER — Ambulatory Visit

## 2024-01-01 VITALS — BP 110/68 | HR 87 | Temp 98.4°F | Resp 16 | Ht 67.0 in | Wt 147.6 lb

## 2024-01-01 DIAGNOSIS — D509 Iron deficiency anemia, unspecified: Secondary | ICD-10-CM

## 2024-01-01 DIAGNOSIS — O99013 Anemia complicating pregnancy, third trimester: Secondary | ICD-10-CM

## 2024-01-01 DIAGNOSIS — Z3A34 34 weeks gestation of pregnancy: Secondary | ICD-10-CM | POA: Diagnosis not present

## 2024-01-01 MED ORDER — SODIUM CHLORIDE 0.9 % IV SOLN
300.0000 mg | INTRAVENOUS | Status: DC
  Administered 2024-01-01: 300 mg via INTRAVENOUS
  Filled 2024-01-01: qty 15

## 2024-01-01 NOTE — Progress Notes (Signed)
 Diagnosis: Iron Deficiency Anemia  Provider:  Praveen Mannam MD  Procedure: IV Infusion  IV Type: Peripheral, IV Location: R Forearm  Venofer (Iron Sucrose), Dose: 300 mg  Infusion Start Time: 1130  Infusion Stop Time: 1308  Post Infusion IV Care: Patient declined observation and Peripheral IV Discontinued  Discharge: Condition: Good, Destination: Home . AVS Declined  Performed by:  Maximiano JONELLE Pouch, LPN

## 2024-01-05 ENCOUNTER — Encounter: Admitting: Family Medicine

## 2024-01-07 ENCOUNTER — Ambulatory Visit: Admitting: Obstetrics & Gynecology

## 2024-01-07 VITALS — BP 110/73 | HR 93 | Wt 149.0 lb

## 2024-01-07 DIAGNOSIS — O0973 Supervision of high risk pregnancy due to social problems, third trimester: Secondary | ICD-10-CM | POA: Diagnosis not present

## 2024-01-07 DIAGNOSIS — Z3A35 35 weeks gestation of pregnancy: Secondary | ICD-10-CM | POA: Diagnosis not present

## 2024-01-07 DIAGNOSIS — O98113 Syphilis complicating pregnancy, third trimester: Secondary | ICD-10-CM

## 2024-01-07 NOTE — Progress Notes (Addendum)
 "  PRENATAL VISIT NOTE  Subjective:  Allison Robles is a 28 y.o. G3P1011 at [redacted]w[redacted]d being seen today for ongoing prenatal care.  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  Patient reports small amount of clear vaginal discharge.  Contractions: Not present. Vag. Bleeding: None.  Movement: Present.   The following portions of the patient's history were reviewed and updated as appropriate: allergies, current medications, past family history, past medical history, past social history, past surgical history and problem list.   Objective:   Vitals:   01/07/24 1520  BP: 110/73  Pulse: 93  Weight: 149 lb (67.6 kg)    Fetal Status:  Fetal Heart Rate (bpm): 148   Movement: Present    General: Alert, oriented and cooperative. Patient is in no acute distress.  Skin: Skin is warm and dry. No rash noted.   Cardiovascular: Normal heart rate noted  Respiratory: Normal respiratory effort, no problems with respiration noted  Abdomen: Soft, gravid, appropriate for gestational age.  Pain/Pressure: Present     Pelvic: Cervical exam performed in the presence of a chaperone. Thich white cervical mucus notes along cervix and vaginal walls. No pooling of fluid noted.    Extremities: Normal range of motion.  Edema: Trace  Mental Status: Normal mood and affect. Normal behavior. Normal judgment and thought content.      10/12/2023   10:59 AM 03/22/2019    4:19 PM 03/08/2019    8:54 AM  Depression screen PHQ 2/9  Decreased Interest 2 0 0  Down, Depressed, Hopeless 1 0 0  PHQ - 2 Score 3 0 0  Altered sleeping 0 3 0  Tired, decreased energy 2 0 2  Change in appetite 1 0 0  Feeling bad or failure about yourself  1  2 1   Trouble concentrating 0 0 0  Moving slowly or fidgety/restless 0 0 0  Suicidal thoughts 0 0 0  PHQ-9 Score 7  5  3       Data saved with a previous flowsheet row definition        10/12/2023   11:00 AM 03/22/2019    4:21 PM 03/08/2019    8:55 AM  GAD 7 : Generalized Anxiety Score  Nervous, Anxious, on Edge 1 0 2  Control/stop worrying 1 0 3  Worry too much - different things 2 0 0  Trouble relaxing 1 0 0  Restless 1 0 0  Easily annoyed or irritable 2 0 0  Afraid - awful might happen 2 0 0  Total GAD 7 Score 10 0 5    Assessment and Plan:  Pregnancy: G3P1011 at [redacted]w[redacted]d 1. Supervision of high risk pregnancy due to social problems in third trimester (Primary) - No issues reported.  2. Syphilis affecting pregnancy in third trimester - No record of positive test in record. Two doses of penicillin  g (2.4 mu) administered, last on 8/26. RPR non-reactive 9/18.   3. Maternal iron  deficiency anemia affecting pregnancy in third trimester, antepartum  - Three iron  infusions administered to date, last on 11/07.  4. [redacted] weeks gestation of pregnancy  - Discussed GBS screening next week.  Preterm labor symptoms and general obstetric precautions including but not limited to vaginal bleeding, contractions, leaking of fluid and fetal movement were  reviewed in detail with the patient. Please refer to After Visit Summary for other counseling recommendations.   No follow-ups on file.  Future Appointments  Date Time Provider Department Center  01/11/2024 10:45 AM Vita Morrow, MD PFM-PFM [facility]  01/12/2024  1:35 PM Milly Olam DELENA EDDY [facility]  01/18/2024  9:35 AM Claudene Para , CNM [facility]  01/18/2024  3:00 PM [facility] WL-MLABL None  01/25/2024 10:55 AM Jerilynn Longs, NP [facility]  02/01/2024 10:15 AM Izell Harari, MD [facility]  02/08/2024 10:15 AM Cleatus Moccasin, MD [facility]    Nunzio DELENA Cena Verma, Medical Student  Attestation of Attending  Supervision of Medical Student: Evaluation and management procedures were performed by the medical student under my supervision and collaboration.  I have reviewed the student's note and chart, and I agree with the management and plan.  LYNWOOD SOLOMONS, MD, FACOG Attending Obstetrician & Gynecologist Faculty Practice, [facility]  "

## 2024-01-11 ENCOUNTER — Ambulatory Visit: Payer: Self-pay | Admitting: Family Medicine

## 2024-01-11 ENCOUNTER — Encounter: Payer: Self-pay | Admitting: Family Medicine

## 2024-01-11 VITALS — BP 120/82 | HR 94 | Wt 149.4 lb

## 2024-01-11 DIAGNOSIS — Z789 Other specified health status: Secondary | ICD-10-CM

## 2024-01-11 DIAGNOSIS — Z3A36 36 weeks gestation of pregnancy: Secondary | ICD-10-CM

## 2024-01-11 DIAGNOSIS — D563 Thalassemia minor: Secondary | ICD-10-CM | POA: Diagnosis not present

## 2024-01-11 NOTE — Progress Notes (Signed)
   Name: Allison Robles   Date of Visit: 01/11/24   Date of last visit with me: Visit date not found   CHIEF COMPLAINT:  Chief Complaint  Patient presents with   Establish Care    New patient. Barley gaining weight, [redacted] weeks pregnant, only gaining 2 pounds at every doctors appointment.        HPI:  Discussed the use of AI scribe software for clinical note transcription with the patient, who gave verbal consent to proceed.  History of Present Illness   Allison Robles is a 28 year old female who presents with pregnancy and weight gain concerns.  She is currently [redacted] weeks pregnant and has been receiving prenatal care at a clinic near [facility]. Her weight gain during this pregnancy has been minimal, with only a two-pound increase, which is less than her previous pregnancy. She is concerned about her weight gain, but reports that her prenatal care providers have not expressed any concerns to her. She is taking prenatal vitamins and iron supplements.  She is a carrier of alpha thalassemia, but there has been no testing to determine if this has affected the baby. She is expecting a boy, whom she plans to name Zechariah.  She has no significant past medical history and has not experienced any medical issues prior to this pregnancy. She is not planning to breastfeed after delivery.         OBJECTIVE:       01/11/2024   10:32 AM  Depression screen PHQ 2/9  Decreased Interest 0  Down, Depressed, Hopeless 0  PHQ - 2 Score 0     BP Readings from Last 3 Encounters:  01/11/24 120/82  01/07/24 110/73  01/01/24 110/68    BP 120/82   Pulse 94   Wt 149 lb 6.4 oz (67.8 kg)   SpO2 98%   BMI 23.40 kg/m    Physical Exam          Physical Exam Constitutional:      Appearance: Normal appearance.  Neurological:     General: No focal deficit present.     Mental Status: She is alert and oriented to person, place, and time. Mental status is at baseline.      ASSESSMENT/PLAN:   Assessment & Plan [redacted] weeks gestation of pregnancy  Alpha thalassemia silent carrier  Weight gain advised    Assessment and Plan    [redacted] weeks gestation of pregnancy [redacted] weeks pregnant with no significant weight gain concerns. Plans to gain more weight postpartum. Not breastfeeding. - Scheduled follow-up in eight weeks postpartum to discuss weight gain and medication options. - Discussed options with patient regarding medications that can help with appetite.  Patient states that she has always been overweight and would like to increase her weight post pregnancy as she has a difficult time with appetite.  We can consider starting some medications to see if that will help increasing appetite but advised patient that this will likely be done for 6 weeks postpartum.  Patient does not plan to breast-feed.  Alpha thalassemia trait (carrier) Carrier of alpha thalassemia trait. No prenatal testing conducted for the baby yet. - Plan for postnatal testing of the baby for alpha thalassemia trait.         Vicki Chaffin A. Vita MD [facility]

## 2024-01-12 ENCOUNTER — Ambulatory Visit (INDEPENDENT_AMBULATORY_CARE_PROVIDER_SITE_OTHER): Admitting: Advanced Practice Midwife

## 2024-01-12 ENCOUNTER — Other Ambulatory Visit: Payer: Self-pay

## 2024-01-12 ENCOUNTER — Encounter: Payer: Self-pay | Admitting: Advanced Practice Midwife

## 2024-01-12 VITALS — BP 118/78 | HR 90 | Wt 150.6 lb

## 2024-01-12 DIAGNOSIS — O98113 Syphilis complicating pregnancy, third trimester: Secondary | ICD-10-CM

## 2024-01-12 DIAGNOSIS — Z3A26 26 weeks gestation of pregnancy: Secondary | ICD-10-CM

## 2024-01-12 DIAGNOSIS — D509 Iron deficiency anemia, unspecified: Secondary | ICD-10-CM

## 2024-01-12 DIAGNOSIS — O0973 Supervision of high risk pregnancy due to social problems, third trimester: Secondary | ICD-10-CM

## 2024-01-12 NOTE — Progress Notes (Signed)
 PRENATAL VISIT NOTE  Subjective:  Allison Robles is a 28 y.o. G3P1011 at [redacted]w[redacted]d being seen today for ongoing prenatal care.  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  Patient reports occasional contractions.  Contractions: Not present. Vag. Bleeding: None.  Movement: Present. Denies leaking of fluid.   The following portions of the patient's history were reviewed and updated as appropriate: allergies, current medications, past family history, past medical history, past social history, past surgical history and problem list.   Objective:   Vitals:   01/12/24 1349  BP: 118/78  Pulse: 90  Weight: 150 lb 9.6 oz (68.3 kg)    Fetal Status:  Fetal Heart Rate (bpm): 141   Movement: Present    General: Alert, oriented and cooperative. Patient is in no acute distress.  Skin: Skin is warm and dry. No rash noted.   Cardiovascular: Normal heart rate noted  Respiratory: Normal respiratory effort, no problems with respiration noted  Abdomen: Soft, gravid, appropriate for gestational age.  Pain/Pressure: Present     Pelvic: Cervical exam deferred        Extremities: Normal range of motion.  Edema: None  Mental Status: Normal mood and affect. Normal behavior. Normal judgment and thought content.      01/12/2024    4:10 PM 01/11/2024   10:32 AM 10/12/2023   10:59 AM  Depression screen PHQ 2/9  Decreased Interest 1 0 2  Down, Depressed, Hopeless 1 0 1  PHQ - 2 Score 2 0 3  Altered sleeping 1  0  Tired, decreased energy 1  2  Change in appetite 1  1  Feeling bad or failure about yourself  1  1  Trouble concentrating 1  0  Moving slowly or fidgety/restless 1  0  Suicidal thoughts 1  0  PHQ-9  Score 9  7      Data saved with a previous flowsheet row definition        01/12/2024    4:10 PM 10/12/2023   11:00 AM 03/22/2019    4:21 PM 03/08/2019    8:55 AM  GAD 7 : Generalized Anxiety Score  Nervous, Anxious, on Edge 1 1 0 2  Control/stop worrying 1 1 0 3  Worry too much - different things 1 2 0 0  Trouble relaxing 1 1 0 0  Restless 1 1 0 0  Easily annoyed or irritable 1 2 0 0  Afraid - awful might happen 1 2 0 0  Total GAD 7 Score 7 10 0 5    Assessment and Plan:  Pregnancy: G3P1011 at [redacted]w[redacted]d 1. Supervision of high risk pregnancy due to social problems in third trimester (Primary) --HR due to social reasons, lives at [facility]. Letter provided with proof of pregnancy in June 2025. --Anticipatory guidance about next visits/weeks of pregnancy given.   2. Maternal iron deficiency anemia affecting pregnancy in third trimester, antepartum --No s/sx of anemia --S/P IV iron  3. Syphilis affecting pregnancy in third trimester --S/P PCN x 3 doses -- 4. [redacted] weeks gestation of pregnancy   Term labor symptoms and general obstetric precautions including but not limited to vaginal bleeding, contractions, leaking of fluid and fetal movement were reviewed in detail with the patient.  Please refer to After Visit Summary for other counseling recommendations.   Return in about 1 week (around 01/19/2024) for HROB.  Future Appointments  Date Time Provider Department Center  01/18/2024  9:35 AM Claudene Rains , CNM [facility]  01/18/2024  3:00 PM [facility] WL-MLABL None  01/25/2024 10:55 AM Jerilynn Longs, NP [facility]  02/01/2024 10:15 AM Izell Harari, MD [facility]  02/08/2024 10:15 AM Cleatus Moccasin, MD [facility]  02/29/2024 10:30 AM Vita Morrow, MD PFM-PFM [address] [place], [place]

## 2024-01-16 LAB — CULTURE, BETA STREP (GROUP B ONLY): Strep Gp B Culture: NEGATIVE

## 2024-01-18 ENCOUNTER — Ambulatory Visit (INDEPENDENT_AMBULATORY_CARE_PROVIDER_SITE_OTHER): Admitting: Advanced Practice Midwife

## 2024-01-18 ENCOUNTER — Other Ambulatory Visit: Payer: Self-pay

## 2024-01-18 ENCOUNTER — Other Ambulatory Visit (HOSPITAL_COMMUNITY)

## 2024-01-18 VITALS — BP 114/76 | HR 84 | Wt 151.9 lb

## 2024-01-18 DIAGNOSIS — O98113 Syphilis complicating pregnancy, third trimester: Secondary | ICD-10-CM

## 2024-01-18 DIAGNOSIS — O99013 Anemia complicating pregnancy, third trimester: Secondary | ICD-10-CM | POA: Diagnosis not present

## 2024-01-18 DIAGNOSIS — Z3A37 37 weeks gestation of pregnancy: Secondary | ICD-10-CM

## 2024-01-18 DIAGNOSIS — O0973 Supervision of high risk pregnancy due to social problems, third trimester: Secondary | ICD-10-CM

## 2024-01-18 DIAGNOSIS — D509 Iron deficiency anemia, unspecified: Secondary | ICD-10-CM

## 2024-01-18 NOTE — Progress Notes (Signed)
   PRENATAL VISIT NOTE  Subjective:  Allison Robles is a 28 y.o. G3P1011 at [redacted]w[redacted]d being seen today for ongoing prenatal care.  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   Patient reports occasional contractions.  Contractions: Not present. Vag. Bleeding: None.  Movement: Present. Denies leaking of fluid.   The following portions of the patient's history were reviewed and updated as appropriate: allergies, current medications, past family history, past medical history, past social history, past surgical history and problem list.   Objective:   Vitals:   01/18/24 1031  BP: 114/76  Pulse: 84  Weight: 151 lb 14.4 oz (68.9 kg)    Fetal Status: Fetal Heart Rate (bpm): 159   Movement: Present     General:  Alert, oriented and cooperative. Patient is in no acute distress.  Skin: Skin is warm and dry. No rash noted.   Cardiovascular: Normal heart rate noted  Respiratory: Normal respiratory effort, no problems with respiration noted  Abdomen: Soft, gravid, appropriate for gestational age.  Pain/Pressure: Present (back & pelvic)     Pelvic: Cervical exam performed in the presence of a chaperone        Extremities: Normal range of motion.  Edema: None  Mental Status: Normal mood and affect. Normal behavior. Normal judgment and thought content.   Assessment and Plan:  Pregnancy: G3P1011 at [redacted]w[redacted]d 1. Syphilis affecting pregnancy in third trimester (Primary)  2. Supervision of high risk pregnancy due to social problems in third trimester  3. Maternal iron  deficiency anemia affecting pregnancy in third trimester, antepartum - Completed Iron  infusions - CBC  4. [redacted] weeks gestation of  pregnancy  Term labor symptoms and general obstetric precautions including but not limited to vaginal bleeding, contractions, leaking of fluid and fetal movement were reviewed in detail with the patient. Please refer to After Visit Summary for other counseling recommendations.   Return in about 1 week (around 01/25/2024) for ROB.  Future Appointments  Date Time Provider Department Center  01/18/2024  3:00 PM [facility] WL-ML[facility] None  01/25/2024 10:55 AM Jerilynn Longs, NP [facility]  02/01/2024 10:15 AM Izell Harari, MD [facility]  02/08/2024 10:15 AM Cleatus Moccasin, MD [facility]  02/29/2024 10:30 AM Vita Morrow, MD PFM-PFM [address]    Nicolo Tomko  Claudene, [facility]

## 2024-01-25 ENCOUNTER — Encounter: Payer: Self-pay | Admitting: Student

## 2024-01-25 ENCOUNTER — Ambulatory Visit: Admitting: Student

## 2024-01-25 ENCOUNTER — Other Ambulatory Visit (HOSPITAL_COMMUNITY)
Admission: RE | Admit: 2024-01-25 | Discharge: 2024-01-25 | Disposition: A | Source: Ambulatory Visit | Attending: Student | Admitting: Student

## 2024-01-25 VITALS — BP 115/79 | HR 92 | Wt 153.8 lb

## 2024-01-25 DIAGNOSIS — O26893 Other specified pregnancy related conditions, third trimester: Secondary | ICD-10-CM | POA: Insufficient documentation

## 2024-01-25 DIAGNOSIS — D509 Iron deficiency anemia, unspecified: Secondary | ICD-10-CM

## 2024-01-25 DIAGNOSIS — N898 Other specified noninflammatory disorders of vagina: Secondary | ICD-10-CM | POA: Insufficient documentation

## 2024-01-25 DIAGNOSIS — O0973 Supervision of high risk pregnancy due to social problems, third trimester: Secondary | ICD-10-CM

## 2024-01-25 NOTE — Progress Notes (Signed)
 Subjective:  Allison Robles is a 28 y.o. G3P1011 at [redacted]w[redacted]d being seen today for prenatal care.  Patient reports backache and occasional contractions and pressure of pelvis.  Pt also reports a thick, white chunky discharge but denies itching or dysuria.  Contractions: Not present.  Vag. Bleeding: None. Movement: Present. Denies leaking of fluid.   The following portions of the patient's history were reviewed and updated as appropriate: allergies, current medications, past family history, past medical history, past social history, past surgical history and problem list.   Objective:   Vitals:   01/25/24 1103  BP: 115/79  Pulse: 92  Weight: 153 lb 12.8 oz (69.8 kg)    Fetal Status: Fetal Heart Rate (bpm): 136   Movement: Present     General:  Alert, oriented and cooperative. Patient is in no acute distress.  Skin: Skin is warm and dry. No rash noted.   Cardiovascular: Normal heart rate noted  Respiratory: Normal respiratory effort, no problems with respiration noted  Abdomen: Soft, gravid, appropriate for gestational age. Pain/Pressure: Present (pressure)     Vaginal: Vag. Bleeding: None.    Vag D/C Character: Thick (white)  Cervix: Exam revealed      1/thick/ballotable  Extremities: Normal range of motion.  Edema: None  Mental Status: Normal mood and affect. Normal behavior. Normal judgment and thought content.    Assessment and Plan:  Pregnancy: G3P1011 at [redacted]w[redacted]d   1. Vaginal discharge during pregnancy in third trimester (Primary)  - Cervicovaginal ancillary only( [place]) -Cervicovaginal swab for possible infection, however, discharge looks reassuring for no infection.  2. Supervision of high risk pregnancy due to social problems in third trimester  -Pt educated on labor precautions and when to go to the hospital, including but not limited to: contraction strength and consistency -Anticipatory Guidance given regarding next visit and possible membrane sweeps/induction  etc. - Pt did not have follow up growth u/s d/t fibroids and syphilis history due to possible scheduling issue.  At this point, scheduling an ultrasound at this point is not possible.  Bedside ultrasound conducted for positioning.  Baby is in cephalic position. -1 cm dilation likely d/t previous gestations -RSV vaccine for baby in the hospital as pt missed the window for RSV vaccine. Lexine circuit given for labor preparation  3. Maternal iron  deficiency anemia affecting pregnancy in third trimester, antepartum -Pt states keeping warm in the cold d/t anemia  -S/p IV iron . CBC previously ordered, not collected yet.     Term labor symptoms and general obstetric precautions including but not limited to vaginal bleeding, contractions, leaking of fluid and fetal movement were reviewed in detail with the patient. Please refer to After Visit Summary for other counseling recommendations.     Mayo Speaker, Student-PA

## 2024-01-25 NOTE — Patient Instructions (Signed)
 The [employer]  This circuit takes at least 90 minutes to complete so clear your schedule and make mental preparations so you can relax in your environment. The second step requires a lot of pillows so gather them up before beginning Before starting, you should empty your bladder! Have a nice drink nearby, and make sure it has a straw! If you are having contractions, this circuit should be done through contractions, try not to change positions between steps Before you begin...  "I named this 'circuit' after my friend Deneen Harts, who shared and discussed it with me when I was working with a client whose labor seemed to be stalled out and no longer progressing... This circuit is useful to help get the baby lined up, ideally, in the "Left Occiput Anterior" (LOA) Position, both before labor begins and when some corrections need to be done during labor. Prenatally, this position set can help to rotate a baby. As a natural method of induction, this can help get things going if baby just needed a gentle nudge of position to set things off. To the best of my knowledge, this group of positions will not "hurt" a baby that is already lined up correctly." - Sharlyn Bologna   Step One: Open-knee Chest Stay in this position for 30 minutes, start in cat/cow, then drop your chest as low as you can to the bed or the floor and your bottom as high as you can. Knees should be fairly wide apart, and the angle between the torso/thighs should be wider than 90 degrees. Wiggle around, prop with lots of pillows and use this time to get totally relaxed. This position allows the baby to scoot out of the pelvis a bit and gives them room to rotate, shift their head position, etc. If the pregnant person finds it helpful, careful positioning with a rebozo under the belly, with gentle tension from a support person behind can help maintain this position for the full 30 minutes.  Step WGN:[id number] Left Side  Lying Roll to your left side, bringing your top leg as high as possible and keeping your bottom leg straight. Roll forward as much as possible, again using a lot of pillows. Sink into the bed and relax some more. If you fall asleep, that's totally okay and you can stay there! If not, stay here for at least another half an hour. Try and get your top right leg up towards your head and get as rolled over onto your belly as much as possible. If you repeat the circuit during labor, try alternating left and right sides. We know the photo the left is actually right side... just flip the image in your head.  Step Three: Moving and Lunges Lunge, walk stairs facing sideways, 2 at a time, (have a spotter downstairs of you!), take a walk outside with one foot on the curb and the other on the street, sit on a birth ball and hula- anything that's upright and putting your pelvis in open, asymmetrical positions. Spend at least 30 minutes doing this one as well to give your baby a chance to move down. If you are lunging or stair or curb walking, you should lunge/walk/go up stairs in the direction that feels better to you. The key with the lunge is that the toes of the higher leg and mom's belly button should be at right angles. Do not lunge over your knee, that closes the pelvis.     Megan Hamilton Miles: Tourist information centre manager - [email or web address] Jasmine December  Muza, CD, BDT (DONA), LCCE, FACCE: Supporting Content - [email or web address] Rulon Eisenmenger: Photography - [email or web address] Luther Hearing CD/CDT [facility]): Print and Webmaster - [email or web address] [employer] [email or web address]

## 2024-01-25 NOTE — Assessment & Plan Note (Signed)
-  Denies contractions, LOF, or vaginal bleeding. Good fetal  movement -Reports thick white discharge. Denies irritation. Wet prep collected -Reviewed labor precautions

## 2024-01-26 LAB — CERVICOVAGINAL ANCILLARY ONLY
Bacterial Vaginitis (gardnerella): NEGATIVE
Candida Glabrata: NEGATIVE
Candida Vaginitis: NEGATIVE
Chlamydia: NEGATIVE
Comment: NEGATIVE
Comment: NEGATIVE
Comment: NEGATIVE
Comment: NEGATIVE
Comment: NEGATIVE
Comment: NORMAL
Neisseria Gonorrhea: NEGATIVE
Trichomonas: NEGATIVE

## 2024-01-28 ENCOUNTER — Ambulatory Visit: Payer: Self-pay | Admitting: Student

## 2024-02-01 ENCOUNTER — Ambulatory Visit: Admitting: Obstetrics and Gynecology

## 2024-02-01 ENCOUNTER — Encounter: Payer: Self-pay | Admitting: Obstetrics and Gynecology

## 2024-02-01 ENCOUNTER — Other Ambulatory Visit: Payer: Self-pay

## 2024-02-01 VITALS — BP 118/77 | HR 89 | Wt 151.9 lb

## 2024-02-01 DIAGNOSIS — D259 Leiomyoma of uterus, unspecified: Secondary | ICD-10-CM

## 2024-02-01 DIAGNOSIS — O99013 Anemia complicating pregnancy, third trimester: Secondary | ICD-10-CM | POA: Diagnosis not present

## 2024-02-01 DIAGNOSIS — Z3A39 39 weeks gestation of pregnancy: Secondary | ICD-10-CM

## 2024-02-01 DIAGNOSIS — D509 Iron deficiency anemia, unspecified: Secondary | ICD-10-CM | POA: Diagnosis not present

## 2024-02-01 DIAGNOSIS — O3413 Maternal care for benign tumor of corpus uteri, third trimester: Secondary | ICD-10-CM

## 2024-02-01 DIAGNOSIS — D563 Thalassemia minor: Secondary | ICD-10-CM | POA: Diagnosis not present

## 2024-02-01 DIAGNOSIS — O98113 Syphilis complicating pregnancy, third trimester: Secondary | ICD-10-CM | POA: Diagnosis not present

## 2024-02-01 DIAGNOSIS — O3410 Maternal care for benign tumor of corpus uteri, unspecified trimester: Secondary | ICD-10-CM | POA: Insufficient documentation

## 2024-02-01 NOTE — Progress Notes (Signed)
 PRENATAL VISIT NOTE  Subjective:  Allison Robles is a 28 y.o. G3P1011 at [redacted]w[redacted]d being seen today for ongoing prenatal care.  She is currently monitored for the following issues for this high-risk pregnancy and has Alpha thalassemia silent carrier; Syphilis affecting pregnancy; Supervision of high risk pregnancy due to social problems in third trimester; Maternal iron  deficiency anemia affecting pregnancy in third trimester, antepartum; Vaginal discharge during pregnancy in third trimester; and Uterine fibroid in pregnancy on their problem list.  Patient reports no complaints.  Contractions: Irritability. Vag. Bleeding: None.  Movement: Present. Denies leaking of fluid.   The following portions of the patient's history were reviewed and updated as appropriate: allergies, current medications, past family history, past medical history, past social history, past surgical history and problem list.   Objective:   Vitals:   02/01/24 1009  BP: 118/77  Pulse: 89  Weight: 151 lb 14.4 oz (68.9 kg)    Fetal Status:  Fetal Heart Rate (bpm): 143 Fundal Height: 38 cm Movement: Present Presentation: Vertex  General: Alert, oriented and cooperative. Patient is in no acute distress.  Skin: Skin is warm and dry. No rash noted.   Cardiovascular: Normal heart rate noted  Respiratory: Normal respiratory effort, no problems with respiration noted  Abdomen: Soft, gravid, appropriate for gestational age.  Pain/Pressure: Present (pressure)     Pelvic: Cervical exam performed in the presence of a chaperone Dilation: 2.5 Effacement (%): 50 Station: Ballotable  Extremities: Normal range of motion.  Edema: None  Mental Status: Normal mood and affect. Normal behavior. Normal judgment and thought content.      01/12/2024    4:10 PM 01/11/2024   10:32 AM 10/12/2023   10:59 AM  Depression screen PHQ 2/9  Decreased Interest 1 0 2  Down, Depressed, Hopeless 1 0 1  PHQ - 2 Score 2 0 3  Altered sleeping 1  0   Tired, decreased energy 1  2  Change in appetite 1  1  Feeling bad or failure about yourself  1  1  Trouble concentrating 1  0  Moving slowly or fidgety/restless 1  0  Suicidal thoughts 1  0  PHQ-9 Score 9  7      Data saved with a previous flowsheet row definition        01/12/2024    4:10 PM 10/12/2023   11:00 AM 03/22/2019    4:21 PM 03/08/2019    8:55 AM  GAD 7 : Generalized Anxiety Score  Nervous, Anxious, on Edge 1 1 0 2  Control/stop worrying 1 1 0 3  Worry too much - different things 1 2 0 0  Trouble relaxing 1 1 0 0  Restless 1 1 0 0  Easily annoyed or irritable 1 2 0 0  Afraid - awful might happen 1 2 0 0  Total GAD 7 Score 7 10 0 5    Assessment and Plan:  Pregnancy: G3P1011 at [redacted]w[redacted]d 1. Uterine fibroid in pregnancy (Primary) On 9/12, 2-3cm IM fibroids seen. Pt declines repeat growth u/s  2. Syphilis affecting pregnancy in third trimester S/p PCN x 3, with last dose on 9/4  3. [redacted] weeks gestation of pregnancy Post dates testing next visit and can set up 41wk IOL then Membranes stripped  4. Alpha thalassemia silent carrier  5. Maternal iron  deficiency anemia affecting pregnancy in third trimester, antepartum S/p IV iron   Term labor symptoms and general obstetric precautions including but not limited to vaginal bleeding, contractions, leaking of  fluid and fetal movement were reviewed in detail with the patient. Please refer to After Visit Summary for other counseling recommendations.   Return in about 1 week (around 02/08/2024) for in person, low risk ob, in office nst/bpp, md or app.  Future Appointments  Date Time Provider Department Center  02/08/2024 10:15 AM Cleatus Moccasin, MD [facility]  02/29/2024 10:30 AM Vita Morrow, MD [facility] [phone] [facility] Bebe Furry, MD

## 2024-02-04 ENCOUNTER — Inpatient Hospital Stay (HOSPITAL_COMMUNITY)
Admission: AD | Admit: 2024-02-04 | Discharge: 2024-02-06 | DRG: 807 | Disposition: A | Attending: Family Medicine | Admitting: Family Medicine

## 2024-02-04 ENCOUNTER — Inpatient Hospital Stay (HOSPITAL_COMMUNITY): Admitting: Anesthesiology

## 2024-02-04 ENCOUNTER — Other Ambulatory Visit: Payer: Self-pay

## 2024-02-04 ENCOUNTER — Encounter (HOSPITAL_COMMUNITY): Payer: Self-pay | Admitting: Family Medicine

## 2024-02-04 DIAGNOSIS — Z87891 Personal history of nicotine dependence: Secondary | ICD-10-CM

## 2024-02-04 DIAGNOSIS — Z3490 Encounter for supervision of normal pregnancy, unspecified, unspecified trimester: Secondary | ICD-10-CM

## 2024-02-04 DIAGNOSIS — O3413 Maternal care for benign tumor of corpus uteri, third trimester: Secondary | ICD-10-CM | POA: Diagnosis present

## 2024-02-04 DIAGNOSIS — O0973 Supervision of high risk pregnancy due to social problems, third trimester: Secondary | ICD-10-CM

## 2024-02-04 DIAGNOSIS — Z8249 Family history of ischemic heart disease and other diseases of the circulatory system: Secondary | ICD-10-CM

## 2024-02-04 DIAGNOSIS — Z3A39 39 weeks gestation of pregnancy: Secondary | ICD-10-CM

## 2024-02-04 DIAGNOSIS — O98119 Syphilis complicating pregnancy, unspecified trimester: Secondary | ICD-10-CM | POA: Diagnosis present

## 2024-02-04 DIAGNOSIS — D259 Leiomyoma of uterus, unspecified: Secondary | ICD-10-CM | POA: Diagnosis present

## 2024-02-04 DIAGNOSIS — D509 Iron deficiency anemia, unspecified: Secondary | ICD-10-CM | POA: Diagnosis present

## 2024-02-04 DIAGNOSIS — O9902 Anemia complicating childbirth: Principal | ICD-10-CM | POA: Diagnosis present

## 2024-02-04 LAB — TYPE AND SCREEN
ABO/RH(D): O POS
Antibody Screen: NEGATIVE

## 2024-02-04 LAB — CBC
HCT: 44.5 % (ref 36.0–46.0)
Hemoglobin: 14.5 g/dL (ref 12.0–15.0)
MCH: 26.5 pg (ref 26.0–34.0)
MCHC: 32.6 g/dL (ref 30.0–36.0)
MCV: 81.4 fL (ref 80.0–100.0)
Platelets: 251 K/uL (ref 150–400)
RBC: 5.47 MIL/uL — ABNORMAL HIGH (ref 3.87–5.11)
RDW: 15.9 % — ABNORMAL HIGH (ref 11.5–15.5)
WBC: 14.8 K/uL — ABNORMAL HIGH (ref 4.0–10.5)
nRBC: 0 % (ref 0.0–0.2)

## 2024-02-04 MED ORDER — SOD CITRATE-CITRIC ACID 500-334 MG/5ML PO SOLN: 30.0000 mL | ORAL | Status: DC | PRN

## 2024-02-04 MED ORDER — OXYTOCIN-SODIUM CHLORIDE 30-0.9 UT/500ML-% IV SOLN
2.5000 [IU]/h | INTRAVENOUS | Status: DC
  Administered 2024-02-04: 2.5 [IU]/h via INTRAVENOUS

## 2024-02-04 MED ORDER — LIDOCAINE HCL (PF) 1 % IJ SOLN: 30.0000 mL | INTRAMUSCULAR | Status: DC | PRN

## 2024-02-04 MED ORDER — OXYCODONE-ACETAMINOPHEN 5-325 MG PO TABS: 2.0000 | ORAL_TABLET | ORAL | Status: DC | PRN

## 2024-02-04 MED ORDER — LACTATED RINGERS IV SOLN
500.0000 mL | INTRAVENOUS | Status: DC | PRN
  Administered 2024-02-04: 500 mL via INTRAVENOUS

## 2024-02-04 MED ORDER — FENTANYL-BUPIVACAINE-NACL 0.5-0.125-0.9 MG/250ML-% EP SOLN
EPIDURAL | Status: AC
  Filled 2024-02-04: qty 250

## 2024-02-04 MED ORDER — PHENYLEPHRINE 80 MCG/ML (10ML) SYRINGE FOR IV PUSH (FOR BLOOD PRESSURE SUPPORT): 80.0000 ug | PREFILLED_SYRINGE | INTRAVENOUS | Status: DC | PRN

## 2024-02-04 MED ORDER — LIDOCAINE HCL (PF) 1 % IJ SOLN
INTRAMUSCULAR | Status: AC
  Filled 2024-02-04: qty 30

## 2024-02-04 MED ORDER — FENTANYL-BUPIVACAINE-NACL 0.5-0.125-0.9 MG/250ML-% EP SOLN: 12.0000 mL/h | EPIDURAL | Status: DC | PRN

## 2024-02-04 MED ORDER — ACETAMINOPHEN 325 MG PO TABS: 650.0000 mg | ORAL_TABLET | ORAL | Status: DC | PRN

## 2024-02-04 MED ORDER — ONDANSETRON HCL 4 MG/2ML IJ SOLN: 4.0000 mg | Freq: Four times a day (QID) | INTRAMUSCULAR | Status: DC | PRN

## 2024-02-04 MED ORDER — LACTATED RINGERS IV SOLN: INTRAVENOUS | Status: DC

## 2024-02-04 MED ORDER — LACTATED RINGERS IV SOLN: 500.0000 mL | Freq: Once | INTRAVENOUS | Status: DC

## 2024-02-04 MED ORDER — PHENYLEPHRINE 80 MCG/ML (10ML) SYRINGE FOR IV PUSH (FOR BLOOD PRESSURE SUPPORT)
PREFILLED_SYRINGE | INTRAVENOUS | Status: AC
  Filled 2024-02-04: qty 10

## 2024-02-04 MED ORDER — EPHEDRINE 5 MG/ML INJ: 10.0000 mg | INTRAVENOUS | Status: DC | PRN

## 2024-02-04 MED ORDER — OXYTOCIN-SODIUM CHLORIDE 30-0.9 UT/500ML-% IV SOLN
INTRAVENOUS | Status: AC
  Filled 2024-02-04: qty 500

## 2024-02-04 MED ORDER — DIPHENHYDRAMINE HCL 50 MG/ML IJ SOLN: 12.5000 mg | INTRAMUSCULAR | Status: DC | PRN

## 2024-02-04 MED ORDER — OXYCODONE-ACETAMINOPHEN 5-325 MG PO TABS
1.0000 | ORAL_TABLET | ORAL | Status: DC | PRN
  Administered 2024-02-04: 1 via ORAL
  Filled 2024-02-04: qty 1

## 2024-02-04 MED ORDER — OXYTOCIN BOLUS FROM INFUSION
333.0000 mL | Freq: Once | INTRAVENOUS | Status: AC
  Administered 2024-02-04: 333 mL via INTRAVENOUS

## 2024-02-04 MED ORDER — FLEET ENEMA RE ENEM: 1.0000 | ENEMA | RECTAL | Status: DC | PRN

## 2024-02-04 MED ORDER — OXYTOCIN 10 UNIT/ML IJ SOLN
INTRAMUSCULAR | Status: AC
  Filled 2024-02-04: qty 1

## 2024-02-04 NOTE — Discharge Summary (Signed)
 Postpartum Discharge Summary     Patient Name: Allison Robles DOB: Sep 04, 1995 MRN: [id number]  Date of admission: 02/04/2024 Delivery date:02/04/2024 Delivering provider: FREDIRICK GLENYS RAMAN Date of discharge: 02/06/2024  Admitting diagnosis: Pregnancy [Z34.90] Intrauterine pregnancy: [redacted]w[redacted]d     Secondary diagnosis:  Principal Problem:   Pregnancy Active Problems:   Indication for care or intervention related to labor and delivery   Syphilis affecting pregnancy   Supervision of high risk pregnancy due to social problems in third trimester   Maternal iron  deficiency anemia affecting pregnancy in third trimester, antepartum   Uterine fibroid in pregnancy  Additional problems:     Discharge diagnosis: Term Pregnancy Delivered and Anemia                                              Post partum procedures:none Augmentation: N/A Complications: None  Hospital course: Onset of Labor With Vaginal Delivery      28 y.o. yo H6E7987 at [redacted]w[redacted]d was admitted in Active Labor on 02/04/2024. Labor course was complicated by none  Membrane Rupture Time/Date: 10:33 PM,02/04/2024  Delivery Method:Vaginal, Spontaneous Operative Delivery:N/A Episiotomy: None Lacerations:    Patient had a postpartum course complicated by n/a.  She is ambulating, tolerating a regular diet, passing flatus, and urinating well. Patient is discharged home in stable condition on 02/06/2024.  Newborn Data: Birth date:02/04/2024 Birth time:10:53 PM Gender:Female Living status:Living Apgars:9 ,9  Weight:3490 g  Magnesium Sulfate received: No BMZ received: No Rhophylac:N/A MMR:N/A T-DaP:Given prenatally Flu: Yes given prenatally RSV Vaccine received: No Transfusion:No  Immunizations received: Immunization History  Administered Date(s) Administered   Influenza, Seasonal, Injecte, Preservative Fre 11/02/2023   Influenza,inj,Quad PF,6+ Mos 12/20/2018   Tdap 02/08/2019, 11/02/2023    Physical exam  Vitals:    02/05/24 1010 02/05/24 1344 02/05/24 2054 02/06/24 0605  BP: 110/65 113/70 126/72 117/79  Pulse: 78 81 89 81  Resp: 18 18 18 18   Temp:  98.5 F (36.9 C) 98.4 F (36.9 C) 98 F (36.7 C)  TempSrc:  Oral Oral Oral  SpO2: 99% 99% 100% 99%   General: alert, cooperative, and no distress Lochia: appropriate Uterine Fundus: firm DVT Evaluation: No evidence of DVT seen on physical exam. No significant calf/ankle edema. Labs: Lab Results  Component Value Date   WBC 15.5 (H) 02/05/2024   HGB 12.7 02/05/2024   HCT 38.1 02/05/2024   MCV 80.7 02/05/2024   PLT 210 02/05/2024      Latest Ref Rng & Units 07/20/2023    3:27 PM  CMP  Glucose 70 - 99 mg/dL 72   BUN 6 - 20 mg/dL 5   Creatinine 9.55 - 8.99 mg/dL 9.26   Sodium 864 - 854 mmol/L 138   Potassium 3.5 - 5.1 mmol/L 3.7   Chloride 98 - 111 mmol/L 108   CO2 22 - 32 mmol/L 18   Calcium 8.9 - 10.3 mg/dL 9.5   Total Protein 6.5 - 8.1 g/dL 8.0   Total Bilirubin 0.0 - 1.2 mg/dL 0.8   Alkaline Phos 38 - 126 U/L 45   AST 15 - 41 U/L 32   ALT 0 - 44 U/L 17    Edinburgh Score:    02/05/2024   10:10 AM  Edinburgh Postnatal Depression Scale Screening Tool  I have been able to laugh and see the funny side of things. --   No data  recorded  After visit meds:  Allergies as of 02/06/2024   No Known Allergies      Medication List     TAKE these medications    acetaminophen  325 MG tablet Commonly known as: Tylenol  Take 2 tablets (650 mg total) by mouth every 4 (four) hours as needed (for pain scale < 4).   Blood Pressure Monitoring Devi 1 Device by Does not apply route once a week.   cyclobenzaprine  10 MG tablet Commonly known as: FLEXERIL  Take 1 tablet (10 mg total) by mouth 3 (three) times daily as needed for muscle spasms.   famotidine  20 MG tablet Commonly known as: PEPCID  Take 1 tablet (20 mg total) by mouth 2 (two) times daily.   ferrous sulfate  325 (65 FE) MG tablet Take 1 tablet (325 mg total) by mouth daily with  breakfast. Take 1 tablet (325mg ) by mouth every other day   ibuprofen  600 MG tablet Commonly known as: ADVIL  Take 1 tablet (600 mg total) by mouth every 6 (six) hours.   polyethylene glycol powder 17 GM/SCOOP powder Commonly known as: GLYCOLAX /MIRALAX  Take 17 g by mouth daily as needed.   Prenatal 27-1 MG Tabs Take 1 tablet by mouth daily.         Discharge home in stable condition Infant Feeding: Bottle Infant Disposition:[facility] Discharge instruction: per After Visit Summary and Postpartum booklet. Activity: Advance as tolerated. Pelvic rest for 6 weeks.  Diet: routine diet Future Appointments: Future Appointments  Date Time Provider Department Center  02/29/2024 10:30 AM Vita Morrow, MD [facility] [id number]  03/10/2024 11:15 AM Zina Jerilynn LABOR, MD [facility]   Follow up Visit:  Follow-up Information     [facility] Follow up in 4 week(s).   Specialty: Obstetrics and Gynecology Why: pp check, they will call you with an appointment Contact information: [address] [place]  [address] [phone]                Msg sent by T. Fredirick 02/05/24 Please schedule this patient for a In person postpartum visit in 4 weeks with the following provider: Any provider. Additional Postpartum F/U:Postpartum Depression checkup  Low risk pregnancy complicated by: none Delivery mode:  Vaginal, Spontaneous Anticipated Birth Control:  PP Depo given   02/06/2024 Donnice CHRISTELLA Carolus, MD

## 2024-02-04 NOTE — Anesthesia Preprocedure Evaluation (Deleted)
 Anesthesia Evaluation  Patient identified by MRN, date of birth, ID band Patient awake    Reviewed: Allergy & Precautions, NPO status , Patient's Chart, lab work & pertinent test results  Airway Mallampati: II  TM Distance: >3 FB Neck ROM: Full    Dental no notable dental hx.    Pulmonary former smoker   Pulmonary exam normal breath sounds clear to auscultation       Cardiovascular negative cardio ROS Normal cardiovascular exam Rhythm:Regular Rate:Normal     Neuro/Psych negative neurological ROS  negative psych ROS   GI/Hepatic negative GI ROS, Neg liver ROS,,,  Endo/Other  negative endocrine ROS    Renal/GU negative Renal ROS     Musculoskeletal   Abdominal   Peds  Hematology Lab Results      Component                Value               Date                      WBC                      14.8 (H)            02/04/2024                HGB                      14.5                02/04/2024                HCT                      44.5                02/04/2024                MCV                      81.4                02/04/2024                PLT                      251                 02/04/2024              Anesthesia Other Findings   Reproductive/Obstetrics (+) Pregnancy                              Anesthesia Physical Anesthesia Plan  ASA: 2  Anesthesia Plan: Epidural   Post-op Pain Management:    Induction:   PONV Risk Score and Plan:   Airway Management Planned:   Additional Equipment:   Intra-op Plan:   Post-operative Plan:   Informed Consent: I have reviewed the patients History and Physical, chart, labs and discussed the procedure including the risks, benefits and alternatives for the proposed anesthesia with the patient or authorized representative who has indicated his/her understanding and acceptance.       Plan Discussed with:   Anesthesia Plan  Comments: (39.3 wk G3P1 w anemia for LEA)  Anesthesia Quick Evaluation

## 2024-02-04 NOTE — H&P (Addendum)
 Allison Robles is an 28 y.o. G3P1011 [redacted]w[redacted]d female.   Chief Complaint: labor HPI: began ctx's @ 0900. Have worsened. Has some bloody show. No LOF. + FM.  PNC: [facility] Reports +ve syphilis testing when donating plasma. No +ve RPR with us . Treated x 3 doses.  OB History     Gravida  3   Para  1   Term  1   Preterm  0   AB  1   Living  1      SAB  1   IAB  0   Ectopic  0   Multiple  0   Live Births  1           Past Medical History:  Diagnosis Date   Anemia    Bacterial meningitis    as newborn, has hearing loss in left ear    Past Surgical History:  Procedure Laterality Date   NO PAST SURGERIES      Family History  Problem Relation Age of Onset   Hypertension Mother    Social History:  reports that she quit smoking about 5 years ago. Her smoking use included cigars. She has never used smokeless tobacco. She reports that she does not currently use drugs after having used the following drugs: Marijuana. She reports that she does not drink alcohol.   Allergies[1]  Medications Prior to Admission  Medication Sig Dispense Refill   Blood Pressure Monitoring DEVI 1 Device by Does not apply route once a week. (Patient not taking: Reported on 02/01/2024) 1 Device 0   cyclobenzaprine  (FLEXERIL ) 10 MG tablet Take 1 tablet (10 mg total) by mouth 3 (three) times daily as needed for muscle spasms. 30 tablet 1   famotidine  (PEPCID ) 20 MG tablet Take 1 tablet (20 mg total) by mouth 2 (two) times daily. 60 tablet 3   ferrous sulfate  325 (65 FE) MG tablet Take 1 tablet (325 mg total) by mouth daily with breakfast. Take 1 tablet (325mg ) by mouth every other day 90 tablet 1   Prenatal 27-1 MG TABS Take 1 tablet by mouth daily. 30 tablet 11     Pertinent items are noted in HPI.  Objective: Blood pressure 132/77, pulse 80, SpO2 100%. General appearance: alert, cooperative, and appears stated age Head: Normocephalic, without obvious abnormality, atraumatic Neck: supple,  symmetrical, trachea midline Lungs: normal effort Heart: regular rate and rhythm Abdomen: gravid, NT Extremities: extremities normal, atraumatic, no cyanosis or edema Skin: Skin color, texture, turgor normal. No rashes or lesions Neurologic: Grossly normal   Labs: No results found for this or any previous visit (from the past 24 hours).          ABO, Rh: O/Positive/-- (08/18 1017)  Antibody: Negative (08/18 1017)  Rubella: 7.36 (08/18 1017)  RPR: Non Reactive (09/18 0948)  HBsAg: Negative (08/18 1017)  HIV: Non Reactive (09/18 0948)  GBS: Negative/-- (11/19 9175)    Radiology studies: No results found.  Prenatal Transfer Tool  Maternal Diabetes: No Genetic Screening: Normal Maternal Ultrasounds/Referrals: Normal Fetal Ultrasounds or other Referrals:  None Maternal Substance Abuse:  No  Significant Maternal Medications:  Meds include: Other: Pepcid  Flexeril , iron  Significant Maternal Lab Results: Group B Strep negative Treated for syphilis though never +ve with us .  Assessment/Plan Principal Problem:   Pregnancy Active Problems:   Indication for care or intervention related to labor and delivery   Syphilis affecting pregnancy   #Labor: active #Pain: nitrous, wants epidural #FWB: Cat 1 #ID:   GBS negative #  MOF: Formula #MOC: Depo #Circ:  ? #Anemia: s/p IV iron  Glenys GORMAN Birk 02/04/2024, 10:27 PM      [1] No Known Allergies

## 2024-02-04 NOTE — MAU Note (Signed)
 Allison Robles is a 28 y.o. at [redacted]w[redacted]d here in MAU reporting: CTX that started around 0900 and have intensified throughout the day. Pt also reports some bloody show with no LOF and reports +FM  LMP:  Onset of complaint: 0900 Pain score: 9/10  FHT: pt placed in MAU exam room from EMT   Lab orders placed from triage: labor eval

## 2024-02-05 ENCOUNTER — Encounter (HOSPITAL_COMMUNITY): Payer: Self-pay | Admitting: Family Medicine

## 2024-02-05 LAB — SYPHILIS: RPR W/REFLEX TO RPR TITER AND TREPONEMAL ANTIBODIES, TRADITIONAL SCREENING AND DIAGNOSIS ALGORITHM: RPR Ser Ql: NONREACTIVE

## 2024-02-05 LAB — CBC
HCT: 38.1 % (ref 36.0–46.0)
Hemoglobin: 12.7 g/dL (ref 12.0–15.0)
MCH: 26.9 pg (ref 26.0–34.0)
MCHC: 33.3 g/dL (ref 30.0–36.0)
MCV: 80.7 fL (ref 80.0–100.0)
Platelets: 210 K/uL (ref 150–400)
RBC: 4.72 MIL/uL (ref 3.87–5.11)
RDW: 16.1 % — ABNORMAL HIGH (ref 11.5–15.5)
WBC: 15.5 K/uL — ABNORMAL HIGH (ref 4.0–10.5)
nRBC: 0 % (ref 0.0–0.2)

## 2024-02-05 LAB — HIV ANTIBODY (ROUTINE TESTING W REFLEX): HIV Screen 4th Generation wRfx: NONREACTIVE

## 2024-02-05 MED ORDER — COCONUT OIL OIL: 1.0000 | TOPICAL_OIL | Status: DC | PRN

## 2024-02-05 MED ORDER — SIMETHICONE 80 MG PO CHEW: 80.0000 mg | CHEWABLE_TABLET | ORAL | Status: DC | PRN

## 2024-02-05 MED ORDER — OXYCODONE HCL 5 MG PO TABS
5.0000 mg | ORAL_TABLET | ORAL | Status: DC | PRN
  Administered 2024-02-05: 5 mg via ORAL
  Filled 2024-02-05: qty 1

## 2024-02-05 MED ORDER — ACETAMINOPHEN 325 MG PO TABS: 650.0000 mg | ORAL_TABLET | ORAL | Status: DC | PRN

## 2024-02-05 MED ORDER — OXYCODONE HCL 5 MG PO TABS
10.0000 mg | ORAL_TABLET | ORAL | Status: DC | PRN
  Administered 2024-02-05: 10 mg via ORAL
  Filled 2024-02-05: qty 2

## 2024-02-05 MED ORDER — PRENATAL MULTIVITAMIN CH
1.0000 | ORAL_TABLET | Freq: Every day | ORAL | Status: DC
  Administered 2024-02-05 – 2024-02-06 (×3): 1 via ORAL
  Filled 2024-02-05 (×2): qty 1

## 2024-02-05 MED ORDER — DIPHENHYDRAMINE HCL 25 MG PO CAPS: 25.0000 mg | ORAL_CAPSULE | Freq: Four times a day (QID) | ORAL | Status: DC | PRN

## 2024-02-05 MED ORDER — BENZOCAINE-MENTHOL 20-0.5 % EX AERO: 1.0000 | INHALATION_SPRAY | CUTANEOUS | Status: DC | PRN

## 2024-02-05 MED ORDER — SENNOSIDES-DOCUSATE SODIUM 8.6-50 MG PO TABS
2.0000 | ORAL_TABLET | Freq: Every day | ORAL | Status: DC
  Administered 2024-02-05 – 2024-02-06 (×2): 2 via ORAL
  Filled 2024-02-05 (×2): qty 2

## 2024-02-05 MED ORDER — TETANUS-DIPHTH-ACELL PERTUSSIS 5-2-15.5 LF-MCG/0.5 IM SUSP: 0.5000 mL | Freq: Once | INTRAMUSCULAR | Status: DC

## 2024-02-05 MED ORDER — ZOLPIDEM TARTRATE 5 MG PO TABS: 5.0000 mg | ORAL_TABLET | Freq: Every evening | ORAL | Status: DC | PRN

## 2024-02-05 MED ORDER — MEDROXYPROGESTERONE ACETATE 150 MG/ML IM SUSP
150.0000 mg | INTRAMUSCULAR | Status: AC | PRN
  Administered 2024-02-06: 150 mg via INTRAMUSCULAR
  Filled 2024-02-05: qty 1

## 2024-02-05 MED ORDER — MEASLES, MUMPS & RUBELLA VAC ~~LOC~~ SUSR: 0.5000 mL | Freq: Once | SUBCUTANEOUS | Status: DC

## 2024-02-05 MED ORDER — ONDANSETRON HCL 4 MG/2ML IJ SOLN: 4.0000 mg | INTRAMUSCULAR | Status: DC | PRN

## 2024-02-05 MED ORDER — ONDANSETRON HCL 4 MG PO TABS: 4.0000 mg | ORAL_TABLET | ORAL | Status: DC | PRN

## 2024-02-05 MED ORDER — DIBUCAINE (PERIANAL) 1 % EX OINT: 1.0000 | TOPICAL_OINTMENT | CUTANEOUS | Status: DC | PRN

## 2024-02-05 MED ORDER — WITCH HAZEL-GLYCERIN EX PADS: 1.0000 | MEDICATED_PAD | CUTANEOUS | Status: DC | PRN

## 2024-02-05 MED ORDER — IBUPROFEN 600 MG PO TABS
600.0000 mg | ORAL_TABLET | Freq: Four times a day (QID) | ORAL | Status: DC
  Administered 2024-02-05 – 2024-02-06 (×6): 600 mg via ORAL
  Filled 2024-02-05 (×6): qty 1

## 2024-02-05 NOTE — Progress Notes (Signed)
 POSTPARTUM PROGRESS NOTE  Post Partum Day 1  Subjective:  Allison Robles is a 28 y.o. H6E7987 s/p SVD at [redacted]w[redacted]d.  No acute events overnight.  Pt denies problems with ambulating, voiding or po intake.  She denies nausea or vomiting.  Pain is well controlled.  She has not had flatus. She has not had bowel movement. Expected as patient delivered at 22:53 on 02/04/24. Discussed watching for passing flatus. Lochia Minimal.   Objective: Blood pressure 116/66, pulse (!) 58, temperature 98 F (36.7 C), temperature source Oral, resp. rate 18, SpO2 100%, unknown if currently breastfeeding.  Physical Exam:  General: alert, cooperative and no distress Chest: no respiratory distress, normal WOB on RA Abdomen: soft, nontender,  Uterine Fundus: firm, appropriately tender DVT Evaluation: No calf swelling or tenderness Extremities: no edema Skin: warm, dry  Recent Labs    02/04/24 2218 02/05/24 0545  HGB 14.5 12.7  HCT 44.5 38.1    Assessment/Plan: Allison Robles is a 28 y.o. H6E7987 s/p SVD at [redacted]w[redacted]d   PPD#1 - Doing well Contraception: depo Feeding: bottle Dispo: Plan for discharge tomorrow.   LOS: 1 day   Allison Gathers, DO 02/05/2024, 9:33 AM

## 2024-02-05 NOTE — Patient Instructions (Signed)
 If you are interested in an outpatient lactation consultation -- available in-office or virtually -- please reach out to us  at:  [facility] (First Floor) ?? [address], [place], [place]  ?? [phone] Please leave a message on our lactation voicemail box. We welcome any lactation-related questions or concerns -- our team is here to support you and your baby.  Lactation Support Groups Join us  at: [facility] ?? Tuesdays, 10:00 AM - 12:00 PM ?? [address], [place] [employer], [place]  Lactating parents and lap babies are welcome!  ?? [email or web address]  ?? [email or web address] -------------  Si est interesado en una consulta ambulatoria de lactancia, disponible en el consultorio o virtualmente, comunquese con nosotros en:  [facility] ?? [address], [place], [place]  ?? [phone] Por favor, deje un mensaje en nuestro buzn de voz de lactancia. Estamos aqu para responder cualquier pregunta o inquietud relacionada con la lactancia y para apoyarle a usted y a su beb.  Grupos de Apoyo para la Lactancia nase a nosotros en: [facility] ?? Martes, de 10:00 a. m. a 12:00 p. m. ?? [address], [place], [place]  Se admiten madres lactantes y bebs en regazo.  ?? [email or web address]  ?? [email or web address]      Allison Robles, [facility]

## 2024-02-05 NOTE — Lactation Note (Signed)
 This note was copied from a baby's chart. Lactation Consultation Note  Patient Name: Allison Robles Unijb'd Date: 02/05/2024 Age:26 hours   Mom chooses to formula feed.  Maternal Data    Feeding Nipple Type: Regular  LATCH Score                    Lactation Tools Discussed/Used    Interventions    Discharge    Consult Status Consult Status: Complete    Zlatan Hornback G 02/05/2024, 1:40 AM

## 2024-02-05 NOTE — Clinical Social Work Maternal (Addendum)
 CLINICAL SOCIAL WORK MATERNAL/CHILD NOTE  Patient Details  Name: Mauria Asquith MRN: [id number] Date of Birth: [birth year]  Date:  02/05/2024  Clinical Social Worker Initiating Note:  Nat Quiet, [place] Date/Time: Initiated:  02/05/24/1252     Child's Name:  Rudolph Millard Hmpqqb   Biological Parents:  Mother, Father (Mother: Forest Pruden 1997/08/25,)   Need for Interpreter:  None   Reason for Referral:  Adoption   Address:  [address] [place] [address]    Phone number:  [phone]  Additional phone number:   Household Members/Support Persons (HM/SP):    ([facility])   HM/SP Name Relationship DOB or Age  HM/SP -1  Yancy Cedar II  Son   04/27/2019  HM/SP -2        HM/SP -3        HM/SP -4        HM/SP -5        HM/SP -6        HM/SP -7        HM/SP -8          Natural Supports (not living in the home):  Spouse/significant other   Professional Supports: None   Employment: Unemployed   Type of Work:     Education:  Other (comment) (GED/CNA)   Homebound arranged:    Financial Resources:  Medicaid   Other Resources:  Sales Executive     Cultural/Religious Considerations Which May Impact Care:    Strengths:  Other  (Comment)  MOB will seek private attorney to transfer custody of her infant to cousin and his wife Astronomer and Building Control Surveyor))   Psychotropic Medications:         Pediatrician:       Pediatrician List:   [facility] [place]    [place]    [facility]      Pediatrician Fax Number:    Risk Factors/Current Problems:  None   Cognitive State:  Alert  , Able to Concentrate     Mood/Affect:  Calm  , Comfortable  , Interested     CSW Assessment: CSW received consult for adoption, Lives at [facility] [place], wants to give guardianship of baby to family member. CSW met with MOB to offer support and complete assessment.    CSW met with MOB at  bedside and introduced CSW role. MOB appeared calm and engaged with CSW during the visit. MOB had family present at bedside that she introduced as her cousin Bernardino Griffy 12/21/1997) and cousin's wife Murel [facility] 11/08/1998). CSW offered MOB privacy, and they left the room to allow privacy. MOB confirmed that her demographic information on hospital file was correct and stated that resides at [facility] with her son Helon Cedar II; 04/27/19). She reported that her son is currently in school and staying with his maternal grandmother until she is released from the hospital. MOB reported prior to giving birth she arranged for her cousin to have guardianship over the infant and stated that her cousin resides in [place] . She reported that FOB is not involved. MOB reported that she will continue to coordinate the transfer of guardianship process in the community and understands that the infant will be discharged into her care. She reported the first pediatrician appointment will be in [place]  and her cousin would provide the name of the office. MOB acknowledged MOB's decision and acknowledged her strength.  MOB reported that she is currently living in a shelter and cannot afford to care for two children at this time. She reported that her cousin has all essential items to care for the infant including diapers, wipes, clothes, car seat and a crib. CSW inquired about MOB's support system. MOB identified [facility] [place] as her primary source of support.   CSW inquired if MOB experience postpartum depression and anxiety previously. MOB denied mental health concerns and stated that she has felt great this pregnancy, while keeping herself busy. CSW inquired if MOB had history of mental health. MOB denied mental health history. CSW provided education regarding the baby blues period vs. perinatal mood disorders, discussed treatment and gave resources for mental health follow up if concerns arise.  CSW recommends  self-evaluation during the postpartum time period using the New Mom Checklist from Postpartum Progress and encouraged MOB to contact a medical professional if symptoms are noted at any time. CSW assessed MOB for safety. MOB denied SI/HI and DV concerns.     CSW provided review of Sudden Infant Death Syndrome (SIDS) precaution with MOB and MOB's cousin and wife present.   MOB's cousin provided infant's pediatrician; [facility] [address] [place] , [address]  CSW identifies no further need for intervention and no barriers to discharge at this time.   CSW Plan/Description:  Sudden Infant Death Syndrome (SIDS) Education, Perinatal Mood and Anxiety Disorder (PMADs) Education, No Further Intervention Required/No Barriers to Discharge    Nat DELENA Quiet, LCSW 02/05/2024, 12:57 PM

## 2024-02-06 MED ORDER — ACETAMINOPHEN 325 MG PO TABS: 650.0000 mg | ORAL_TABLET | ORAL | 0 refills | Status: AC | PRN

## 2024-02-06 MED ORDER — POLYETHYLENE GLYCOL 3350 17 GM/SCOOP PO POWD: 17.0000 g | Freq: Every day | ORAL | 1 refills | Status: AC | PRN

## 2024-02-06 MED ORDER — IBUPROFEN 600 MG PO TABS: 600.0000 mg | ORAL_TABLET | Freq: Four times a day (QID) | ORAL | 0 refills | Status: AC

## 2024-02-08 ENCOUNTER — Encounter: Admitting: Obstetrics and Gynecology

## 2024-02-13 ENCOUNTER — Telehealth (HOSPITAL_COMMUNITY): Payer: Self-pay

## 2024-02-13 NOTE — Telephone Encounter (Signed)
 02/13/2024 1430  Name: Nygeria Lager MRN: [id number] DOB: 1996-02-15  Reason for Call:  Transition of Care Hospital Discharge Call  Contact Status: Patient Contact Status: Unable to contact  Language assistant needed:          Follow-Up Questions:    Van Postnatal Depression Scale:  In the Past 7 Days:    PHQ2-9 Depression Scale:     Discharge Follow-up:    Post-discharge interventions: NA  Signature  Rosaline Deretha PEAK

## 2024-02-29 ENCOUNTER — Ambulatory Visit: Admitting: Family Medicine

## 2024-03-03 ENCOUNTER — Encounter: Payer: Self-pay | Admitting: Family Medicine

## 2024-03-07 ENCOUNTER — Telehealth: Payer: Self-pay | Admitting: Family Medicine

## 2024-03-07 NOTE — Telephone Encounter (Signed)
 Patient stopped by requesting for us  to send her pharmacy a letter saying that the following medications were terminated the day she gave birth:  Prenatal, acid reflex, ibuprofen , and iron .   Patient states that she needs this as soon as possible for a program that she is enrolled in for her shelter.   Thank you, Allison Robles

## 2024-03-08 ENCOUNTER — Encounter: Payer: Self-pay | Admitting: General Practice

## 2024-03-10 ENCOUNTER — Encounter: Payer: Self-pay | Admitting: *Deleted

## 2024-03-10 ENCOUNTER — Ambulatory Visit: Admitting: Obstetrics and Gynecology

## 2024-03-17 ENCOUNTER — Ambulatory Visit: Payer: Self-pay | Admitting: Student
